# Patient Record
Sex: Male | Born: 1975 | Race: Black or African American
Health system: Western US, Academic
[De-identification: ages and names within clinical notes are randomized; demographics above are authoritative.]

## PROBLEM LIST (undated history)

## (undated) DIAGNOSIS — I10 Essential (primary) hypertension: Secondary | ICD-10-CM

## (undated) DIAGNOSIS — J45909 Unspecified asthma, uncomplicated: Secondary | ICD-10-CM

## (undated) DIAGNOSIS — Z9889 Other specified postprocedural states: Secondary | ICD-10-CM

## (undated) HISTORY — DX: Unspecified asthma, uncomplicated: J45.909

## (undated) HISTORY — PX: PR BX/EXC LYMPH NODE OPEN DEEP CERVICAL NODE: 38510

## (undated) HISTORY — DX: Other specified postprocedural states: Z98.890

## (undated) HISTORY — PX: OTHER SURGICAL HISTORY: SHX169

## (undated) DEATH — deceased

---

## 2010-05-23 ENCOUNTER — Other Ambulatory Visit (HOSPITAL_BASED_OUTPATIENT_CLINIC_OR_DEPARTMENT_OTHER): Payer: Self-pay | Admitting: Anatomic Pathology & Clinical Pathology

## 2010-05-29 LAB — BONE MARROW

## 2011-05-31 ENCOUNTER — Encounter (INDEPENDENT_AMBULATORY_CARE_PROVIDER_SITE_OTHER): Payer: Self-pay | Admitting: Family Medicine

## 2011-05-31 ENCOUNTER — Ambulatory Visit (INDEPENDENT_AMBULATORY_CARE_PROVIDER_SITE_OTHER): Payer: BLUE CROSS/BLUE SHIELD | Admitting: Family Medicine

## 2011-05-31 VITALS — BP 118/81 | HR 68 | Temp 98.2°F | Resp 22 | Wt 152.0 lb

## 2011-05-31 MED ORDER — ALBUTEROL SULFATE HFA 108 (90 BASE) MCG/ACT IN AERS
INHALATION_SPRAY | RESPIRATORY_TRACT | Status: DC
Start: 2011-05-31 — End: 2013-06-01

## 2011-05-31 NOTE — Progress Notes (Signed)
Garrett Ward is a 35 year old male here for headache  Duration: several months, last about 2 hours,   Location: front of the head  Quality: pressure on the head  Timing: off and on time  Severity: mild   Context: no at any time of the day  Modifying factors: takes aleve and that helps  Associated Signs and Symptoms: Cough      Cough  Duration It has been going on for: 4-5 days  Location It is at the: chest tightness and loss of breath  Quality It feels like: hard for him to breath  Timing: some of the time  Severity: mild  Context: shortness of breath at times after smoking  Associated Signs and Symptoms: no fevers or chills.     He has had some weight loss      No current outpatient prescriptions on file.    History     Social History   . Marital Status: N/A     Spouse Name: N/A     Number of Children: N/A   . Years of Education: N/A     Occupational History   . Not on file.     Social History Main Topics   . Smoking status: Current Everyday Smoker   . Smokeless tobacco: Not on file   . Alcohol Use:    . Drug Use:    . Sexually Active: Not on file     Other Topics Concern   . Not on file     Social History Narrative   . No narrative on file       Filed Vitals:    05/31/11 1255   BP: 118/81   Pulse: 68   Temp: 98.2 F (36.8 C)   TempSrc: Oral   Resp: 22   Weight: 152 lb (68.947 kg)   SpO2: 100%                   Review of Systems   Constitutional: Negative.    Eyes: Negative.    Gastrointestinal: Negative.    Genitourinary: Negative.    Musculoskeletal: Negative.    Neurological: Negative.        Physical Exam   Constitutional: He is oriented to person, place, and time. He appears well-developed and well-nourished.   HENT:   Head: Normocephalic and atraumatic.   Right Ear: External ear normal.   Left Ear: External ear normal.   Eyes: Conjunctivae and EOM are normal. Pupils are equal, round, and reactive to light. Right eye exhibits no discharge. Left eye exhibits no discharge. No scleral icterus.   Neck:  Normal range of motion. Neck supple. No JVD present. No tracheal deviation present. No thyromegaly present.   Cardiovascular: Normal rate, regular rhythm, normal heart sounds and intact distal pulses.  Exam reveals no gallop and no friction rub.    No murmur heard.  Pulmonary/Chest: Effort normal and breath sounds normal. No stridor. No respiratory distress. He has no wheezes. He has no rales. He exhibits no tenderness.   Abdominal: Soft. Bowel sounds are normal. He exhibits no distension and no mass. There is no tenderness. There is no rebound.   Musculoskeletal: Normal range of motion. He exhibits no edema and no tenderness.   Lymphadenopathy:     He has no cervical adenopathy.   Neurological: He is alert and oriented to person, place, and time. He has normal reflexes. He displays normal reflexes. No cranial nerve deficit. He exhibits normal muscle tone.  Coordination normal.   Psychiatric: He has a normal mood and affect. His behavior is normal. Judgment and thought content normal.     Assessment and Plan    786.2 Cough  Comment:   Plan: SPIROMETRY, ONSITE, X-RAY CHEST 2 VW, ALBUTEROL        SULFATE (PROAIR HFA) 108 (90 BASE) MCG/ACT         Inhalation Aero Soln, AIRWAY INHALATION         TREATMENT, ALBUTEROL NON-COMP UNIT, SPIROMETRY,        ONSITE  At this time the results of the spiro are a little abnormal.  It looks like he has an obstructive lung disease.  The patients xray was reviewed by myself and looks normal.  The patient has a history of asthma and this being the case, It think a course of albuterol is appropriate even though this is unlikely secondary to no change in pre/post spriro.    This may be secondary to a URI.  I will see him back an 7-14 day.  If he is not better then I will get another spiro and may need to send him to a pulmonologist or get a CT scan.    Small concern for some inflammatory process in the lungs or some other growth.  Will wait and see.             465.9 URI, acute  headache  Comment:  Plan: SPIROMETRY, ONSITE, X-RAY CHEST 2 VW, ALBUTEROL        SULFATE (PROAIR HFA) 108 (90 BASE) MCG/ACT         Inhalation Aero Soln, AIRWAY INHALATION         TREATMENT, ALBUTEROL NON-COMP UNIT, SPIROMETRY,        ONSITE          Ibuprofen for the headache

## 2011-05-31 NOTE — Patient Instructions (Signed)
Index Illustration Illustration Related topics         Asthma   What is asthma?   Asthma is a lung condition that causes wheezing, coughing, and shortness of breath. It is caused by inflammation (swelling) of the lining of the airways in your lungs. Asthma is a chronic condition, which means you may have it the rest of your life.   You may start coughing or wheezing when you breathe in irritants or something you are allergic to. Cold air, chemicals, perfume, and smoke are examples of irritants. Examples of things you might be allergic to, called allergens, are dust, pollen, molds, and animal dander. A cold or the flu might also bring on an asthma attack.   Some people have coughing or wheezing only during or after physical activity. This is called exercise-induced asthma.   Asthma may be mild, moderate, or severe. An asthma attack may last a few minutes or for days. Attacks can happen anywhere and at any time. Severe asthma attacks can be fatal. It is very important to get prompt treatment for asthma attacks and to learn to manage your asthma so you can live a healthy, active life.   About 20 million Americans have asthma, and the number of people who have asthma is increasing worldwide.   How does it occur?   If you have asthma, the airways in your lungs are always somewhat inflamed, even when you do not have any symptoms. When your airways are exposed to irritants or allergens, the airways become more swollen and make more mucus. The tiny muscles in the walls of the airways contract. These reactions cause the airway openings to become smaller, making it harder for air to move in and out. Wheezing is the sound of air moving through the narrowed air passages. The extra mucus in the airways causes coughing.   Some of the factors that may increase the risk of developing asthma are:    low birth weight   having one or more close family members who have asthma   exposure to secondhand smoke or a lot of  environmental pollutants (for example, in a large city)   on-the-job exposure to chemicals, such as the chemicals used in the manufacturing industry, farming, and hairdressing   obesity.  What are the symptoms?   Symptoms are:    wheezing (a high-pitched whistling sound when you breathe in or out)   coughing   shortness of breath, or feeling like you cannot get enough air   chest tightness  You may find that there are things you used to do that you can no longer do because of your trouble with breathing.   How is it diagnosed?   A single attack of wheezing does not mean you have asthma. Some infections and chemicals can cause wheezing that lasts for a short time and then does not happen again. Your healthcare provider will ask about your history of breathing problems. You will have a physical exam. You may have one or more breathing tests. You may be tested before and after taking medicine to see how your symptoms respond to medicine.   How is it treated?   The goal of treatment is to allow you to live a normal, active life. Proper treatment can reduce your day-to-day asthma problems as well as the chance that you will have a bad asthma attack or more problems in the future. Treatment will probably include prescribed medicines and the removal of obvious allergy-causing substances or   irritants from your home.   Two types of medicines are used to control asthma:    quick-relief medicines   long-term-control medicines.  Quick-relief medicines (also called reliever, rescue, or quick-acting medicines)   Albuterol is the generic name of the most widely used quick-relief medicine. It is a type of medicine called a bronchodilator. Bronchodilators relax the muscles in the airways. When the muscles are relaxed, the airways become larger, so there is more space for air to move in and out. You inhale the medicine by breathing it into your lungs as you spray it into your mouth. You use this medicine when you start to  have an asthma attack. In some cases your provider may recommend that you use it on a regular schedule.   You should always carry a bronchodilator with you to use when you begin to wheeze. If you have exercise-induced asthma, you should use the medicine before exercise to prevent wheezing.   Steroid medicines for prolonged, severe attacks   Steroids are another type of medicine that may be used to control asthma symptoms. They are a type of anti-inflammatory medicine that is often used for treatment of a bad attack that has not responded to other treatment. The medicine is usually prednisone and you are usually given between 4 and 10 days of the steroid tablets to take with your other medicines to get your asthma back under control.   Long-term control-medicines (also called controller medicines)   In addition to using a quick-relief bronchodilator when you have asthma attacks, you may need to combine different types of long-term control medicines for the best control of your wheezing. Several types of medicines help prevent asthma attacks. These medicines are now considered the best and safest way to have long-term control of asthma. They help reduce the inflammation in your airways. They are taken on a regular schedule whether or not you are having symptoms.   Long-term-control medicine should be used all the time for year-round asthma. For seasonal asthma, your healthcare provider may instruct you to take a long-term-control medicine just during the months when you usually have asthma symptoms. The medicine does not work well if you start and stop it frequently, for example, every week or two.   Long-term-control medicines cannot stop attacks of wheezing after you have started wheezing. You must use a quick-relief medicine, such as albuterol, when you are wheezing.   The goals of controller medicines are to:    prevent asthma attacks   prevent chronic asthma symptoms, such as shortness of breath   let you have  a fully active life, including participation in sports and other physical activities.  The medicines used most often for long-term control of asthma are:    a long-acting, inhaled bronchodilator called salmeterol (Serevent) used 2 times a day   inhaled steroids, such as Azmacort and Flovent, used 1 to 4 times a day   a type of medicine called leukotriene modifiers, including zafirlukast (Accolate), zileuton (Zyflo), or montelukast (Singulair) pills taken daily.  Some of these medicines are available as combinations.   Other controller medicines include:    theophylline, a pill often taken at bedtime to prevent nighttime wheezing   cromolyn or nedocromil, inhaled 3 to 4 times a day.  If you have severe persistent asthma, your healthcare provider may prescribe low-dose oral steroids for long-term control of the asthma. This medicine may be taken as tablets or a syrup.   You need to work closely with your healthcare   provider to find the treatment right for you. Make sure you understand how to use each of your medicines. Some are quick-acting and meant to be used when you have an asthma attack. Others are slow acting and help prevent attacks but they do not help when you are having an attack.   Inhalers   Make sure you know how to use your inhaler correctly. Some inhalers work best if you hold them 2 inches in front of your mouth when you spray. This helps the medicine get to your lungs rather than getting stuck in your mouth. However, for some inhalers you do need to put your mouth on the inhaler.   If you have an inhaler that should be used a couple of inches in front of your mouth and it is hard for you to hold the inhaler in the right position, ask your healthcare provider for a spacer tube. You can put one end of the spacer in your mouth and attach the inhaler to the other end. This allows you to breathe in slowly and fully and to inhale more of the asthma medicine.   Be sure you ask your healthcare provider  or pharmacist how your inhalers are supposed to be used. Read the directions that come with the inhalers. Also ask your pharmacist how you can know when your inhaler is empty so you can avoid getting caught without medicine.   Peak flow meter   Your breathing ability can change from day to day. For example, illness or seasonal allergies may make your airways more inflamed than usual. Your healthcare provider may prescribe a peak flow meter. You can use the peak flow meter to measure how well you are breathing. It can help you and your healthcare provider know when you might need to increase your dosage of medicine to prevent severe attacks of wheezing.   How long will the effects last?   Asthma is a chronic condition, even though you might not have any symptoms for decades. Asthma is more common in children than adults. People who had asthma as children often have no symptoms once they become adults, but the symptoms may come back later in life. Asthma that develops for the first time in mid- or late life usually continues to be a problem for the rest of your life.   How can I take care of myself?   Learn about asthma and its treatment.    Learn to recognize signs and symptoms of an asthma attack. Many people with asthma either don't recognize their asthma symptoms or underestimate the severity of their symptoms. Pay attention to your symptoms and, if your healthcare provider recommends it, check your breathing with a peak flow meter.   Work with your healthcare provider to develop a written asthma action plan. Following the plan will help you manage your asthma every day. It will help you recognize and handle asthma problems.   Take your medicines exactly as prescribed. Always have your rescue medicine on hand and available. If you are taking a long-term-controller medicine, be sure to take it every day, just as your provider tells you. This prevents asthma attacks. If you are having wheezing even though you are  using your controller medicines, let your healthcare provider know. Don't just stop them.   Learn what can trigger your symptoms and how to stay away from them. For example, you may need to cover your mattress, box springs, and pillows with zippered plastic covers. It may help to   stay indoors when the humidity or pollen count is high. Avoid cigarette smoke.  You should also:    See your healthcare provider for regular checkups as often as recommended.   Ask your provider if it is OK for you to take aspirin. Some people with asthma are allergic to aspirin and it causes them to wheeze. Aspirin is more likely to cause problems than other anti-inflammatory medicines, such as ibuprofen or naproxen, but sometimes they can cause wheezing, too. Acetaminophen (Tylenol) does not cause wheezing.   Get a flu vaccine every October.  If you often have problems with acid indigestion--a condition called gastroesophageal reflux disease, or GERD--your asthma symptoms may increase, especially at night. When you have GERD, stomach acid flows backward into the throat, irritating the upper airway and causing heartburn. If you have heartburn often, talk to your healthcare provider about it. Your provider may prescribe a medicine that reduces the acid in your stomach to help relieve GERD and asthma symptoms. You can also prevent or relieve symptoms of GERD by:    losing weight if you are overweight.   sleeping with your head elevated at least 4 inches.  Asthma can be a life-threatening condition. If your medicines do not seem to be working to keep you breathing comfortably, contact your healthcare provider. If you are having an asthma attack and using your albuterol inhaler has not relieved your symptoms, you must get medical care right away. This may mean going to the emergency room or calling 911.   You can get more information from:   American College of Allergy, Asthma and Immunology   Phone: 1-847-427-1200   Web site:  http://www.acaai.org   American Lung Association   Phone: 1-800-Lung-USA (586-4872)   Web site: http://www.lungusa.org   Asthma and Allergy Foundation of America (AAFA)   Phone: 1-800-7ASTHMA (727-8462)   Web site: http://www.aafa.org   Developed by [employer].   Published by [employer].   Last modified: 2007-09-04   Last reviewed: 2007-02-19   This content is reviewed periodically and is subject to change as new health information becomes available. The information is intended to inform and educate and is not a replacement for medical evaluation, advice, diagnosis or treatment by a healthcare professional.   Adult Health Advisor 2009.4 Index   2009 [employer] and/or its affiliates. All Rights Reserved.

## 2011-06-01 ENCOUNTER — Encounter (INDEPENDENT_AMBULATORY_CARE_PROVIDER_SITE_OTHER): Payer: Self-pay | Admitting: Family Medicine

## 2011-06-02 LAB — X-RAY CHEST 2 VW

## 2011-06-07 ENCOUNTER — Ambulatory Visit (INDEPENDENT_AMBULATORY_CARE_PROVIDER_SITE_OTHER): Payer: BLUE CROSS/BLUE SHIELD | Admitting: Family Medicine

## 2011-06-07 ENCOUNTER — Encounter (INDEPENDENT_AMBULATORY_CARE_PROVIDER_SITE_OTHER): Payer: Self-pay | Admitting: Family Medicine

## 2011-06-07 VITALS — BP 117/80 | HR 63 | Temp 98.6°F | Resp 20 | Wt 152.0 lb

## 2011-06-07 NOTE — Patient Instructions (Addendum)
Index Spanish version        Follow up with PFT, after you get them then  I will call and talk with you about he next step.  As we have talked about this, I will call you back.   You may need to see a specialist or go get an additional scan.

## 2011-06-07 NOTE — Progress Notes (Signed)
Garrett Ward is a 35 year old male here for follow up on shortness of breath.He is feeling better since last time that I saw him.  Duration: 2 weeks  Quality: sounds wet, non productive  Timing: dry cough, some productive cough  Severity: moderate  Context: after a URI  Modifying factors: was given an albuterol has not used it much  Associated Signs and Symptoms: no weight loss, no normal shortness of breath, no history of asthma    Current outpatient prescriptions:ALBUTEROL SULFATE (PROAIR HFA) 108 (90 BASE) MCG/ACT Inhalation Aero Soln, 2 puffs inhaled every 4 hours as needed for asthma; may use every 2 hours for attacks, or preventively before exercise, Disp: 1 Inhaler, Rfl: 2    History     Social History   . Marital Status: N/A     Spouse Name: N/A     Number of Children: N/A   . Years of Education: N/A     Occupational History   . Curator      Social History Main Topics   . Smoking status: Current Everyday Smoker   . Smokeless tobacco: Not on file   . Alcohol Use: Not on file   . Drug Use: Yes   . Sexually Active: Yes -- Male partner(s)     Other Topics Concern   . Not on file     Social History Narrative   . No narrative on file       Filed Vitals:    06/07/11 0759   BP: 117/80   Pulse: 63   Temp: 98.6 F (37 C)   TempSrc: Oral   Resp: 20   Weight: 152 lb (68.947 kg)   SpO2: 100%       No results found for this or any previous visit.          Review of Systems   Constitutional: Negative.    HENT: Negative.    Eyes: Negative.    Respiratory: Positive for cough, chest tightness and shortness of breath.    Cardiovascular: Negative.    Gastrointestinal: Negative.    Musculoskeletal: Negative.    Neurological: Negative.        Physical Exam   Constitutional: He appears well-developed and well-nourished. No distress.   Cardiovascular: Normal rate, regular rhythm, normal heart sounds and intact distal pulses.  Exam reveals no gallop and no friction rub.    No murmur heard.  Pulmonary/Chest: Effort normal  and breath sounds normal. Not tachypneic. No respiratory distress. He has no decreased breath sounds. He has no wheezes. He has no rhonchi. He has no rales. He exhibits no tenderness.   Abdominal: Soft. Bowel sounds are normal.   Skin: He is not diaphoretic.       Assessment and Plan    786.05 Shortness of breath  (primary encounter diagnosis)  Comment:    Plan: SPIROMETRY, ONSITE, REFERRAL TO [facility]         LAB           At this time I have repeated the spirometry.  It continues to be abnormal.  It says his lung age is 23.  I'm somewhat concerned if about his current lung function.  Although this could be secondary to the transient his upper respiratory tract infection I think this is more likely something else.  I've sent him for formal pulmonary function tests.  I've also given him a referral to  a pulmonologist. This may be some sort of autoimmune disease,I feel  that cancer is unlikely considering his chest xray was normal.

## 2011-06-08 ENCOUNTER — Encounter (INDEPENDENT_AMBULATORY_CARE_PROVIDER_SITE_OTHER): Payer: Self-pay | Admitting: Family Medicine

## 2011-06-12 ENCOUNTER — Ambulatory Visit (HOSPITAL_BASED_OUTPATIENT_CLINIC_OR_DEPARTMENT_OTHER): Payer: BLUE CROSS/BLUE SHIELD | Attending: Family Medicine

## 2011-06-12 ENCOUNTER — Ambulatory Visit (HOSPITAL_BASED_OUTPATIENT_CLINIC_OR_DEPARTMENT_OTHER): Payer: Self-pay

## 2011-06-12 DIAGNOSIS — R0602 Shortness of breath: Secondary | ICD-10-CM | POA: Insufficient documentation

## 2011-06-29 ENCOUNTER — Telehealth (INDEPENDENT_AMBULATORY_CARE_PROVIDER_SITE_OTHER): Payer: Self-pay | Admitting: Family Medicine

## 2011-06-29 NOTE — Telephone Encounter (Signed)
Results available in [facility].  Forwarding to provider to advise on request.

## 2011-06-29 NOTE — Telephone Encounter (Deleted)
Called, results are normal.  No need to follow up at this time.

## 2011-06-29 NOTE — Telephone Encounter (Signed)
CONFIRMED PHONE NUMBER: [phone]  CALLERS FIRST AND LAST NAME: Dicie Beam  FACILITY NAME: na TITLE: na  CALLERS RELATIONSHIP:Self  RETURN CALL: General message OK    SUBJECT: Results Request   REASON FOR REQUEST: Had Pulmonary Diagnostics at [facility] lab on 06/12/11. Want the dr to call him and discuss the results    WHO ORDERED THE TEST: Dr.Carey  TYPE OF TEST? Other Pulmonary Testing  WHERE WAS THE TEST DONE: [facility]  WHEN WAS THE TEST DONE: 06/12/11

## 2011-07-03 ENCOUNTER — Telehealth (INDEPENDENT_AMBULATORY_CARE_PROVIDER_SITE_OTHER): Payer: Self-pay | Admitting: Family Medicine

## 2011-07-03 MED ORDER — FLUTICASONE PROPIONATE HFA 110 MCG/ACT IN AERO
INHALATION_SPRAY | RESPIRATORY_TRACT | Status: DC
Start: 2011-07-03 — End: 2012-05-21

## 2011-07-03 NOTE — Telephone Encounter (Signed)
Called and talked with patient, he is still short of breath, will prescribed medications, if it is not better in 2 weeks then he is to return.

## 2011-07-03 NOTE — Telephone Encounter (Signed)
Duplicate request. See 12/7 TE.

## 2011-07-03 NOTE — Telephone Encounter (Signed)
CALLERS FIRST AND LAST NAME: Tracen Makieth Scullin   FACILITY NAME: n/a TITLE: n/a   CALLERS RELATIONSHIP:Self   RETURN CALL: General message OK   SUBJECT: Results Request   REASON FOR REQUEST: Results of breathing tests done 06/12/11   WHO ORDERED THE TEST:Carey, Guadelupe Sabin, MD   TYPE OF TEST? Other Breathing tests   WHERE WAS THE TEST DONE: [facility]   WHEN WAS THE TEST DONE: 06/12/11

## 2011-07-03 NOTE — Telephone Encounter (Signed)
CONFIRMED PHONE NUMBER:   Telephone Information:   Home Phone [phone]   Work Phone [phone]   Mobile [phone]       CALLERS FIRST AND LAST NAME: Michaell Cowing      FACILITY NAME: n/a TITLE: n/a  CALLERS RELATIONSHIP:Self  RETURN CALL: General message OK    SUBJECT: Results Request   REASON FOR REQUEST: Results of breathing tests done 06/12/11    WHO ORDERED THE TEST:Carey, Guadelupe Sabin, MD    TYPE OF TEST? Other Breathing tests  WHERE WAS THE TEST DONE: [facility]  WHEN WAS THE TEST DONE: 06/12/11

## 2012-05-02 ENCOUNTER — Telehealth (INDEPENDENT_AMBULATORY_CARE_PROVIDER_SITE_OTHER): Payer: Self-pay | Admitting: Family Medicine

## 2012-05-02 ENCOUNTER — Encounter (INDEPENDENT_AMBULATORY_CARE_PROVIDER_SITE_OTHER): Payer: Self-pay | Admitting: Family Medicine

## 2012-05-02 ENCOUNTER — Ambulatory Visit (INDEPENDENT_AMBULATORY_CARE_PROVIDER_SITE_OTHER): Payer: BLUE CROSS/BLUE SHIELD | Admitting: Family Medicine

## 2012-05-02 VITALS — BP 123/82 | HR 67 | Temp 98.4°F | Wt 160.0 lb

## 2012-05-02 MED ORDER — FLUOXETINE HCL 10 MG OR TABS
ORAL_TABLET | ORAL | Status: DC
Start: 2012-05-02 — End: 2012-05-20

## 2012-05-02 MED ORDER — SUMATRIPTAN SUCCINATE 25 MG OR TABS
ORAL_TABLET | ORAL | Status: DC
Start: 2012-05-02 — End: 2012-09-12

## 2012-05-02 MED ORDER — PROCHLORPERAZINE MALEATE 10 MG OR TABS
ORAL_TABLET | ORAL | Status: DC
Start: 2012-05-02 — End: 2012-09-12

## 2012-05-02 NOTE — Telephone Encounter (Signed)
CONFIRMED PHONE NUMBER: [phone]  CALLERS FIRST AND LAST NAME: Michaell Cowing  FACILITY NAME: N/A TITLE: N/A  CALLERS RELATIONSHIP:Self  RETURN CALL: OK to leave detailed message with anyone that answers    SUBJECT: Appointment Request   REASON FOR REQUEST: Social Work    REQUEST APPOINTMENT WITH: Nida Boatman or Cheree Ditto  REFERRING PROVIDER: N/A  REQUESTED DATE: N/A  REQUESTED TIME: N/A  UNABLE TO APPOINT: Schedule unavailable

## 2012-05-02 NOTE — Progress Notes (Signed)
Pt here for constant headache x couple weeks, intensifies sometimes, other times its dull ache, but continuously there.  Head pain located front L side of head.   Also c/o sleep problem for couple months, wakes up every hour, or the most can get maybe 4 hours of sleep.      Per orders of Dr. Gar Gibbon, injection of ketorolac 60mg  given by Candie Mile. Patient instructed to remain in clinic for 20 minutes afterwards, and to report any adverse reaction to me immediately.      Vaccine given today without initial adverse effect. YES    .Candie Mile, MA

## 2012-05-02 NOTE — Telephone Encounter (Signed)
Called patient to offer an appointment.   Patient scheduled to see Verlon Au on 10/18 @ 2:30pm.  Thanks and routing to SW.

## 2012-05-02 NOTE — Progress Notes (Signed)
SUBJECTIVE:  CC: headaches  Location: left frontal  Onset: gradual onset  Quality: throbbing sometimes  Duration: lasts days   Timing: wakes with it, up all night with it.  Severity: 7-8/10  Alleviating/Exacerbating: sometimes sleep helps  Associated Sx: nausea, photo and phonophobia, hard time sleeping, a lot of stress, loss of appetite, poor sleep    PATIENT HEALTH QUESTIONNAIRE (PHQ-9)  Over the last 2 weeks, how often have you been bothered by any of the following problems?    1. Little interest or pleasure in doing things: more than half the days (2)  2. Feeling down, depressed, or hopeless: more than half the days (2)  3. Trouble falling or staying asleep,or sleeping too much: nearly every day  (3)  4. Feeling tired or having little energy: several days  (1)  5. Poor appetite or overeating: nearly every day  (3)  6. Feeling bad about yourself-or that you are a failure or have let yourself or your family down: nearly every day  (3)  7. Trouble concentrating on things, such as reading the newspaper or watching television: nearly every day  (3)  8. Moving or speaking so slowly that other people could have noticed. Or the opposite-being so fidgety or restless that you have been moving around a lot  more than usual: several days  (1)  9. Thoughts that you would be better off dead, or of hurting yourself in some way: several days  (1) (passive)    Total Score Depression Severity  1-4 Minimal depression  5-9 Mild depression  10-14 Moderate depression  15-19 Moderately severe depression  20-27 Severe depression    10. If you checked off any problems, how difficult have these problems made it for  you to do your work, take care of things at home, or get along with other people? Somewhat difficult  ---------------------------------------------------------------------------------------------------------  PHQ-9 is adapted from [employer], developed by Drs Molly Maduro L. Spitzer, Nance Pear, Tora Perches, and  colleagues, with an educational grant from [employer]. For research information, contact Dr Everlene Other at [email or web address]. Use of the PHQ-9 may only be made in accordance with the Terms of Use available at This[employer]com.ee. Copyright [employer]. All rights reserved. 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    Reports he does better when he's with other people and gets worse when he's by himself. Gets caught up in his thoughts.    ROS: no fevers, depressed mood, +HA, +insomnia, +GI as above    Reviewed PMH/Problem List  Patient Active Problem List   Diagnosis   . Asthma       Reviewed Medications:   Outpatient Prescriptions Prior to Visit   Medication Sig Dispense Refill   . ALBUTEROL SULFATE (PROAIR HFA) 108 (90 BASE) MCG/ACT Inhalation Aero Soln 2 puffs inhaled every 4 hours as needed for asthma; may use every 2 hours for attacks, or preventively before exercise  1 Inhaler  2   . Fluticasone Propionate  HFA (FLOVENT HFA) 110 MCG/ACT Inhalation Aerosol use 1 puff 2 times per day  1 Inhaler  0       OBJECTIVE:  BP 123/82  Pulse 67  Temp 98.4 F (36.9 C) (Temporal)  Wt 160 lb (72.576 kg)  SpO2 100%  Gen: well appearing, no distress  Eyes: Clear, EOMI, perrl  Throat: - erythema, - exudate, symmetrical palate  Neck: full ROM  Ears: clear B/L TM's  CN 2-12 grossly intact, AxO  Gait: WNL  Psych: depressed mood and affect, slowed speech    ASSESSMENT/PLAN:  346.90 Migraine  (primary encounter diagnosis)  Comment: likely exacerbated by depression and insomnia, perhaps also ETOH  Plan: SUMAtriptan Succinate 25 MG Oral Tab,         Prochlorperazine Maleate 10 MG Oral Tab,         KETOROLAC TROMETHAMINE INJ        Injection today, precautions provided, education on medications prescribed, advised to cut down ETOH use, will f/u with SW-er as scheduled    311 Depression  Comment: dx today, denies dx in hx  Plan: FLUoxetine HCl 10 MG Oral Tab        Clearly major depression. PHQ9 = 19. F/u with SW-er as above.  May be good fit for BHIP. Will place referral and can ascertain if he is comfortable seeing a different SW-er than planned but also may qualify for outside psychology/social work? Would appreciate deeper investigation into ETOH use and dependence as there was time to address today. SI is passive without plan. CC RN to call to check on him in early next week.

## 2012-05-05 ENCOUNTER — Telehealth (INDEPENDENT_AMBULATORY_CARE_PROVIDER_SITE_OTHER): Payer: Self-pay

## 2012-05-05 NOTE — Telephone Encounter (Signed)
Pt has started Fluoxetine.  No side effects.  No SI.  Gave # to Suicide Hotline. He is aware of appt with LCheree Ditto SW on 05/09/12 at 2:30 pm.  Advised pt call with further questions or concerns.

## 2012-05-05 NOTE — Telephone Encounter (Signed)
/  u requested for depression -- did he get his meds? Any side effects? Reiterate warning about calling or provide crisis # for any worsening SI. THanks!            Provider requesting RN touch base with patient Mon/Tues to discuss above.  Patient seen in clinic on 10/11 and put on fluoxetine.

## 2012-05-09 ENCOUNTER — Ambulatory Visit (INDEPENDENT_AMBULATORY_CARE_PROVIDER_SITE_OTHER): Payer: BLUE CROSS/BLUE SHIELD | Admitting: Counselor

## 2012-05-09 ENCOUNTER — Ambulatory Visit (INDEPENDENT_AMBULATORY_CARE_PROVIDER_SITE_OTHER): Payer: Self-pay | Admitting: Counselor

## 2012-05-09 NOTE — Progress Notes (Signed)
Garrett Ward is a 36 year old male who is a patient of Dr. Iona Hansen, Guadelupe Sabin    BHIP INITIAL CLINICAL ASSESSMENT      CURRENT MENTAL HEALTH CONCERNS/REASON(S) FOR VISIT:   On 05/09/12  the patient a 36 year old, male was referred by Vivia Budge to the [facility] Integration Program [facility]) for Depression.    PATIENT'S GOAL(S) FOR TREATMENT:    PSYCHIATRIC HX:   HOSPITALIZATION(S): none    OUTPATIENT TREATMENT: none  PAST DIAGNOSES: none  SUICIDE ATTEMPTS: none  SAFETY CONCERNS none   FIREARMS:unknown   CURRENT/PAST MENTAL HEALTH MEDICATIONS:None    MEDICAL CONCERNS: None      SUBSTANCE USE/ABUSE:  (pull in from previous PCP/MA note and update)   Alcohol: Patient reports alcohol use since age 8 socially.  He has been drinking more over the past two years after he divorced his wife.  He believes his alcohol use is part of the problem that ended his most recent relationship.  Marijuana:none  Other: none   Tobacco:unknown   Caffeine: unknown  Treatment History:  none    ADLS:  Eating Habits: Decline in eating.  "I don't always feel like it"  Sleeping Habits: Patient sleeping only about five hours.  Feels like his mind is "racing" when he tries to sleep.  He has a new work schedule where he starts work at [employer].  Concentration: Patient believes his concentration has diminished some although he is conscientious about this especially when at work.  Self-Care: No concerns re: hygiene    Exercise Habits:: None regularly.  Plays flag football and basketball    MENTAL STATUS:.    ORIENTATION: alert and oriented   ATTITUDE: Cooperative  EPS/TD BHIP: None  APPEARANCE: Appropriate hygiene and grooming;  PSYCHOMOTOR: None  AFFECT: Flat   MOOD: Depressed  SUICIDALITY: Denies and Passive  HOMICIDALITY: Denies   HARM LETHALITY: Low  DEGREE OF ANIMOSITY: none    IMPULSE CONTROL: Controlled  A/V HALLUCINATIONS: unknown  TREATMENT COMPLIANCE: compliant  COUNSELING INTERVENTIONS: Support today and explained BHIP  program.  Asked patient to consider out patient substance abuse.  He is not interested in AA groups.  Speech: Appropriate rate and rhythm  THOUGHT PROCESS:   THOUGHT CONTENT: appropriate  COGNITION:within normal limits  JUDGEMENT: good  INSIGHT: fair  BHIP CARE COORDINATION: PCP  BHIP REFERRALS: social work  BHIP TREATMENT DURATION: 3 months  BHIP SERVICE TYPE: care coordination    SOCIAL/FAMILY HISTORY:   .  Pt. was born and raised in [place].  He has lived in [place] for the past 13 years.  Patient stated that he moved to "start over" and has a supportive male cousin that has assisted him.  He works full time as a Curator.  He has no children and has been married.  He was divorced about 3 years ago and at that time, noticed an increase in alcohol consumption.  He recently had his last relationship broken when his partner left him and he has had no contact with her.  He believes his drinking was a stressor for her.  He is distraught over this loss and has had trouble eating, sleeping, and functioning.  Most recently, he has had some headaches which have lessened the past two weeks.  He is starting to reengage socially with friends but does not feel happy.    MARITAL STATUS/RELATIONSHIPS: divorced  EDUCATION:  12th grade; no diploma   EMPLOYMENT HX: employed, occupation Curator   SIGNIFICANT EVENTS/TRAUMA HX:  Recent break up with partner of two years  LEGAL HX:  unknown  OTHER: none    STRESSORS, STRENGTHS, RESOURCES:  Stressors: marital problems; depression; alcohol abuse  Strengths: common sense; interested in help; open to feedback  Resources: friends and cousin  Interests/Hobbies:  Basketball/football/friends    BARRIERS TO TREATMENT: none    ASSESSMENT/SUMMARY:   Garrett Ward is a 36 year old male presenting with depression and alcohol abuse.  He was asked about his willingness to participate and/or explore outpatient alcohol abuse treatment.  He was agreeable to thinking about this and had never considered  it.  He is not interested in [employer] as he perceives himself as quite private and introverted.  He would like more information on outpatient resources.  Patient is feeling depressed, sad and having difficulty understanding what is going on with him.  He was validated for reaching out and is also trying to get involved with friends again.  He works 40 plus hours per week and is interested in participating in counseling.  He makes good eye contact and is thoughtful with his responses.  Although he presents as somewhat flat, he appears motivated to make change and seek help.     Care Coordinator discussed Behavioral Health Integration Program [facility]) and treatment alternatives. Patient desires to participate in BHIP and agrees to be safe.      Intervention/Plan:  Stabilization of the depression with psychiatric medication and / or brief counseling interventions: Behavioral Activation, Cognitive Behavioral Therapy, Problem-Solving Therapy, Dialectical Behavioral Therapy, Distress Tolerance, Motivational Interviewing, Stress Management, Relapse Prevention, Pain-Management, skill building.  Substance use: Patient has agreed to consider outpatient resources and/or reduce use while participating in Bhip.  Patient will follow-up with Care Coordinator in 90 days.  Patient will follow-up with Kearney Hard, MSW on 05/28/12.

## 2012-05-20 ENCOUNTER — Ambulatory Visit (INDEPENDENT_AMBULATORY_CARE_PROVIDER_SITE_OTHER): Payer: BLUE CROSS/BLUE SHIELD | Admitting: Family Medicine

## 2012-05-20 ENCOUNTER — Encounter (INDEPENDENT_AMBULATORY_CARE_PROVIDER_SITE_OTHER): Payer: Self-pay | Admitting: Family Medicine

## 2012-05-20 VITALS — BP 138/82 | HR 74 | Temp 98.8°F | Wt 154.0 lb

## 2012-05-20 MED ORDER — FLUOXETINE HCL 20 MG OR TABS
ORAL_TABLET | ORAL | Status: DC
Start: 2012-05-20 — End: 2012-09-12

## 2012-05-20 NOTE — Progress Notes (Signed)
Pt here for depression f/u. No side effects to medication.    GAD-7 (Generalized Anxiety Disorder screening tool)  How often during the past 2 weeks have you felt bothered by:  0 = not at all  1 = several days  2 = more than half the days  3 = nearly everyday   1. Feeling nervous, anxious, or on edge  2   2. Not being able to stop or control worrying  3   3. Worrying too much about different things  2   4. Trouble relaxing  1   5. Being so restless that it is hard to sit still  1   6. Becoming easily annoyed or irritable  1   7. Feeling afraid as if something awful might happen  1                                          TOTAL:   11   If you checked off any problems, how difficult have these problems made it for you to do your work, take care of things at home, or get along with other people? somewhat difficult    Scoring: Add the results for question number one through seven to get a total score.  If you score 10 or above you might want to consider one or more of the following: discuss your symptoms with your doctor, contact a local mental health care provider or contact my office for further assessment and possible treatment. Although these questions serve as a useful guide, only an appropriate licensed health professional can make the diagnosis of Generalized Anxiety Disorder.

## 2012-05-20 NOTE — Progress Notes (Signed)
Vaccine Screening Questions      1.  Have you had a serious reaction or an allergic reaction to a vaccine?  NO    2.  Currently have a moderate or severe illness, including fever? (Don't Ask if vaccine   ordered by provider same day)  NO    3.  Ever had a seizure or a brain or other nervous system problem syndrome associated with a vaccine? (DTaP/TDaP/DTP pertinent) NO    4.  Is patient receiving  any live vaccinations today? (Varicella-Chickenpox, MMR-Measles/Mumps/Rubella, Zoster-Shingles)  NO    If YES to any of the questions above - Do NOT give vaccine.  Consult with RN or provider in clinic.  (#4 can be YES if all Live vaccine questions are answered NO)    If NO to all questions above - Patient may receive vaccine.    5.  Do you need to receive the Flu vaccine today? NO    Vaccine information sheet(s) discussed, patient/parent/guardian verbalized understanding? YES     VIS given 05/20/2012 by Ashley D Moseng MA      Vaccine given today without initial adverse effect. YES    Ashley Moseng, MA       .

## 2012-05-20 NOTE — Patient Instructions (Addendum)
Medication that may work for insomnia that isn't addictive is called melatonin. This is available over the counter without a prescription.      Insomnia   What is insomnia?   Having insomnia means you often have trouble falling or staying asleep or going back to sleep if you awaken. Insomnia can be either a short-term or a long-term problem.   Insomnia affects 1 in 3 adults every year in the [place].   How does it occur?   Causes of insomnia include:    stress such as a big deadline at work, a financial problem, or a sick family member    being overweight    depression, anxiety, or other mental health problems    medical problems such as sleep apnea or hyperthyroidism    restless leg syndrome (muscles in your lower legs twitch or tense up during sleep).    use of caffeine or other stimulants    use of alcohol, other depressants, or sedatives, which can relax you but lead to shallow sleep that starts and stops, especially if you use these drugs for a long time    medicines, such as those used to treat asthma    pain and other discomfort caused by an illness such as arthritis    shortness of breath caused by chronic obstructive pulmonary disease (COPD) or heart failure    poor sleep habits, including going to bed at different times or in a noisy environment, or eating or working in bed before sleeping    changes in sleep patterns because of different work hours or travel (jet lag)   Insomnia may be temporary (called situational insomnia) or ongoing (chronic insomnia).   Situational insomnia occurs with a stressful event. It is often caused by noise, pain, worry, or family, work, Surveyor, quantity, or school problems. It lasts 3 weeks or less. This kind of insomnia generally goes away when the stressful event is over or resolved.   Chronic insomnia can be caused by irregular sleep-wake patterns resulting from shift work, drug dependency (including long-term use of sleeping pills or alcohol), stress, illness,  or mental health problems such as anxiety or depression. It lasts longer than 3 weeks and requires treatment of the underlying problem.   What are the symptoms?   Symptoms include:    trouble falling asleep (taking longer than 45 minutes)    awakening often in the night    waking up early in the morning and being unable to go back to sleep    not feeling rested in the morning or feeling tired during the day    restlessness or anxiety as bedtime approaches   How is it diagnosed?   Your healthcare provider will ask you about:    your sleep patterns    use of caffeine, alcohol, medicine, and other drugs    eating and exercise habits    your mental and physical condition    your medical and mental health history, and your family's history    your job and travel patterns   Your healthcare provider may also ask your spouse, bed partner, or other family members about your sleep habits. After talking with you, your healthcare provider may give you a physical exam. A blood sample may be taken for lab tests.   Your healthcare provider may ask you to take notes each morning about:    how long you were in bed    how much time you think you actually slept  how many times and what times you woke up    what time you got up in the morning    your thoughts about the quality of your sleep    recent stresses   Your healthcare provider may suggest that you sleep overnight in a sleep center. At the sleep center you may have a continuous, all-night recording of your breathing, eye movements, muscle tone, blood oxygen levels, heart rate and rhythm, and brain waves.   How is it treated?   If a medical problem is causing your insomnia, your provider will treat you for it. If drug or alcohol abuse is the cause of your insomnia, you will need to stop using these substances. If you have chronic insomnia, it must be treated with management of the underlying problem.   In some cases of temporary insomnia, your healthcare  provider may prescribe medicine to help you sleep until the stressful event is over or resolved. Counseling may also help you deal with psychological problems or reduce stress that may cause or contribute to your insomnia.   Some sleeping medicine can be addictive. Your healthcare provider will work with you to choose the right medicine for short-term or long-term use.   Your healthcare provider may recommend relaxation techniques, changes in diet, cutting out caffeine, and a healthy lifestyle that includes exercise. Your provider also will probably discuss good sleep habits and a regular sleep routine.   How long will the effects last?   Often insomnia lasts for just a few nights. If you cannot sleep almost every night for 2 weeks, tell your healthcare provider. Insomnia that lasts this long usually continues until the cause is identified and treated.   How can I take care of myself?    Tell your healthcare provider if the treatment plan doesn't help.    Tell your provider if you have side effects from medicine prescribed for the insomnia.    Follow your provider's instructions for follow-up visits.   How can I help prevent insomnia?   Practice good sleep hygiene:    Establish a regular bedtime and wake-up time and stick to them even on weekends.    Avoid taking naps.    Exercise regularly during the day. Avoid exercising in the evening.    Keep light levels very low after sunset and keep the bedroom very dark.    Keep the bedroom at a cool temperature.    Use the bed only for sleep and sex, not for reading, using the computer, or watching television.    Go to bed when you are drowsy and get up when you are wide awake.    Avoid caffeine, other stimulants, cigarettes, and alcohol. Do not drink alcohol within 6 hours of bedtime. If you smoke, try to quit smoking entirely. Cutting back on smoking without quitting may lead to nicotine withdrawal in the middle of the night that awakens you.    Eat lightly at  the evening meal and avoid snacks after supper.    Lose weight if you are overweight.    Learn to use relaxation exercises.    Meditate for 20 minutes before you go to bed.    Read something light or entertaining just before you go to bed, to get your mind off the day's troubles.    Consider having white noise in the background, such as a fan blowing.    Try not to focus on falling asleep. For example, don't keep checking the clock and worry  about why you are not asleep yet. If you are awake for more than 30 minutes, leave the bed and do not go back to bed until you feel ready to sleep.    Try to reduce stress in your life by changing the things that cause stress.    Keep a "to do" journal. Before you go to bed, write down all the things you are worrying about. Then write down what you can do tomorrow. Loraine Leriche the other things as things to do later in the week. This will help clear your mind of worry.    Arrange your medicine schedule with your provider so that you take any drugs that might make you sleepy in the evening and drugs that may interfere with sleep during the day.    Avoid daily use of sleep medicines. You may become dependent on them or build up your tolerance to them so that they no longer work as well. Most sleeping pills should not be used for more than 2 weeks in a row.   Developed by Thana Ates Excell Seltzer, RN, [place], and [employer].   Published by [employer].   Last modified: 2007-05-28   Last reviewed: 2007-05-09   This content is reviewed periodically and is subject to change as new health information becomes available. The information is intended to inform and educate and is not a replacement for medical evaluation, advice, diagnosis or treatment by a healthcare professional.   Adult Health Advisor [phone] Index   2009 [employer] and/or its affiliates. All Rights Reserved.

## 2012-05-20 NOTE — Progress Notes (Signed)
CC: f/u depression/anxiety    HPI: Reports depression/anxiety symptoms are a bit better but biggest complaint now is sleeping. Having a hard time sleeping through the night. Has a TV in the bedroom and works an early shift (4AM - 3 PM) 4 days a week. Headaches are improving. More good days than he was having 2 weeks ago. Meeting with SW-er was beneficial. No side effects from fluoxetine. Taking 20 mg now in the morning (and for last 2 weeks or so). More active, handing out with friends.    GAD7 as per MA documentation  PHQ 9: 12    ROS: sleep disrupted as above, appetite still decreased, headaches improved    Past Medical History   Diagnosis Date   . H/O lymph node biopsy      neck   . Asthma      Outpatient Prescriptions Prior to Visit   Medication Sig Dispense Refill   . ALBUTEROL SULFATE (PROAIR HFA) 108 (90 BASE) MCG/ACT Inhalation Aero Soln 2 puffs inhaled every 4 hours as needed for asthma; may use every 2 hours for attacks, or preventively before exercise  1 Inhaler  2   . FLUoxetine HCl 10 MG Oral Tab 1 tab po qd for one week then increase to two tabs daily  60 Tab  0   . Fluticasone Propionate  HFA (FLOVENT HFA) 110 MCG/ACT Inhalation Aerosol use 1 puff 2 times per day  1 Inhaler  0   . Prochlorperazine Maleate 10 MG Oral Tab 1 tab for severe headache  6 Tab  0   . SUMAtriptan Succinate 25 MG Oral Tab 1 TABLET 1 TIME ONLY AT ONSET OF MIGRAINE,REPEAT EVERY 2 HR AS NEEDED up to 4 tabs daily  12 Tab  0     O: BP 138/82  Pulse 74  Temp 98.8 F (37.1 C) (Temporal)  Wt 154 lb (69.854 kg)  SpO2 100%  Gen: non toxic, NAD  Eyes: Clear, EOMI, perrl  Skin: no rashes  Psych: mood improved, occasional laugh and smile, normal thought process and content, congruent affect    A/P: 36 year old here with  296.20 Major depression, single episode  (primary encounter diagnosis)  Comment: anxiety is primary component. PHQ9: 19-->12  Plan: FLUoxetine HCl 20 MG Oral Tab        Continue at current dosing but switch to 20 mg  tablets. Follow up with counselor as scheduled. Consider dose increase if no further decrease in PHQ9. Counseled on staying active, going out with friends, sleep hygiene and insomnia.     ETOH use/abuse  ETOH is likely a large component of sleep issues as well, and counseled on reduction / cessation. He is agreeable to this plan.     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

## 2012-05-28 ENCOUNTER — Ambulatory Visit (INDEPENDENT_AMBULATORY_CARE_PROVIDER_SITE_OTHER): Payer: BLUE CROSS/BLUE SHIELD | Admitting: Clinical

## 2012-05-28 NOTE — Patient Instructions (Signed)
Please schedule next BHIP appointment for 30 minutes in 2 weeks.  You can schedule at front desk.  Thank you.

## 2012-05-28 NOTE — Progress Notes (Signed)
BHIP INTAKE PSYCH EVAL  (VERSION 04/2011)       CURRENT MENTAL HEALTH CONCERNS/REASON(S) FOR VISIT:  Pt had breakup two months ago, before that was not having any struggles, did note that this time last year did have some low mood - before that did not have any problems he could notice.  Pt has been drinking more since breakup with his girlfriend.  Work schedule also changed from 1p - 10p to 4a-3p (this happened Oct 1st).    PATIENT GOAL STATEMENT:  Look at changing drinking habits  Learn how to cope "with things,"   How to cope with changes, feelings related to changes.    VEGETATIVE:   Sleep:much improved in last week, last couple of days sleeping all night,   Appetite:  Concentration:  Energy:    PSYCHIATRIC HX:  See previous SWer assessment.  HOSPITALIZATION: no  OUTPATIENT TREATMENT: no  PAST DIAGNOSES: no  SUICIDE ATTEMPTS: no  SAFETY CONCERNS: no  PAST MEDICATIONS:  No hx, 20mg  prozac today.          SUBSTANCE ABUSE:  Alcohol: Was drinking every day, now trying to just drink on weekend "but I still drink about each night." A beer (MGD, Bud Light 22oz) or shot of tequila - will drink more heavily on weekend, especially when with friends.  Before (started middle of September) "when drinking every day," six shots per night or "until I fell asleep."  This was same for weekday or weekend.  Before breakup with SO was "not that much at all."  SO thought he drank a lot "but it was mostly on the weekend."        Marijuana: no    Other: no others    Tobacco: yes 3-4 cigarettes per day    Caffeine: none      LEGAL HISTORY:      FAMILY PSYCH AND SUBSTANCE USE HISTORY:  Maternal: grandfather: drank but stopped when "it was pretty bad."  No others  Paternal: does not really know that side of the family  Siblings: none      MENTAL STATUS:.    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  Family: mother/father alive, not married, 2 brothers on mom's side, brother/sister on father's side.  Childhood was good, lived mostly with grandparents, mother helped as well.  No hx of abuse/neglect as a child.       Marital Status/Relationship:    Education:  12th grade, did not graduate.  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  Pt appears to have no experience in counseling and struggles with coping skills for anxiety, distress and possible depression.  Pt would benefit from CBT for depression and/or anxiety, also distress tolerance skills and psychoeducation, brief interventions to address ETOH use.      Care Manager discussed Behavioral Health Integration Program [facility]) and tx alternatives. Patient desires to participate in BHIP and agrees to be safe.      Plan:   -PCP and Care Manager (CM) will monitor psych medications, side-effects, patient progress and coordinated care svcs.  -Psychiatrist and Care Manager will case review to evaluate psych meds and treatment plan.  Care Manager will collaborate with PCP  post psychiatrist case/CM review.  -Substance use: Patient has agreed to cut down on ETOH consumption, will address as part of BHIP treatment.  -Care Manager will provide pertinent psycho-education and resources as needed.   -Care Manager will provide brief counseling intervention including Behavioral Activation, CBT, Problem-Solving Training, Distress Tolerance, Motivation Interviewing, Stress Management, Relapse Prevention, Pain-Management skill building.  -Sleep hygiene

## 2012-05-30 ENCOUNTER — Telehealth (INDEPENDENT_AMBULATORY_CARE_PROVIDER_SITE_OTHER): Payer: Self-pay | Admitting: Psychiatry

## 2012-05-30 NOTE — Telephone Encounter (Signed)
The below treatment considerations and suggestions are based on consultation with the patient's care coordinator and a review of information available in the [facility] Mental Health Integrated Tracking System (MHITS). I have not personally examined the patient. All recommendations should be implemented with consideration of the patient's relevant prior history and current clinical status. Please feel free to contact me with any questions about the care of this patient.   [facility]) Psychiatry Consultation Note:  CC/ID: 36 yo M referred to [facility] by his [facility] PCP, Dr. Iona Hansen, for management of depression.    HPI: Pt seen by CM for initial assessment on 11/6; PHQ-9: 9, GAD-7: 8. No SI or passive death wishes noted. Pt endorsed low mood in setting of end of recent relationship and change in shift at work. Taking fluoxetine 20mg  daily which was begun on 10/29.    Past Psych Hx: No hospitalizations or suicide attempts. No prior psychotropic trials.    Family Psych Hx: Maternal GF w/ ETOH abuse vs. Dependence.    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 Family: mother/father alive, not married, 2 brothers on mom's side, brother/sister on father's side. Childhood was good, lived mostly with grandparents, mother helped as well. No hx of abuse/neglect as a child. Marital Status/Relationship:   Education: 12th grade, did not graduate. 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 Drinking escalated following relationship breakup. Denied illicits. Smokes 3-4  cigarettes per day.    Reviewed PMH, All, Current meds.    Brief Assessment: 36 yo M w/ minor depressive symptomatology in setting of recent break-up and change in shift at work. Appears more consistent w/ adjustment reaction as opposed to frank major depression.    Recs:  1. Can continue fluoxetine though could consider discontinuing.  2. Agree w/ CM plan to work w/ pt using CBT, behavioral activation, PST, as well as counseling on ETOH reduction.    Dimitry Melven Sartorius, MD, MPH  [facility] Consulting Psychiatrist

## 2012-06-11 ENCOUNTER — Ambulatory Visit (INDEPENDENT_AMBULATORY_CARE_PROVIDER_SITE_OTHER): Payer: BLUE CROSS/BLUE SHIELD | Admitting: Clinical

## 2012-06-12 ENCOUNTER — Telehealth (INDEPENDENT_AMBULATORY_CARE_PROVIDER_SITE_OTHER): Payer: Self-pay | Admitting: Clinical

## 2012-06-12 NOTE — Telephone Encounter (Signed)
Spoke with patient and rescheduled him for December 5th at 4pm. Thank you.

## 2012-06-12 NOTE — Progress Notes (Signed)
Pt did not show for scheduled BHIP appointment.

## 2012-06-12 NOTE — Telephone Encounter (Signed)
Pt did not show for scheduled appointment, SWer sending to PSR pool to reschedule.

## 2012-06-26 ENCOUNTER — Ambulatory Visit (INDEPENDENT_AMBULATORY_CARE_PROVIDER_SITE_OTHER): Payer: BLUE CROSS/BLUE SHIELD | Admitting: Clinical

## 2012-06-26 NOTE — Patient Instructions (Signed)
Please schedule next BHIP appointment for 30 minutes in 2 weeks.  You can schedule at front desk.  Thank you.

## 2012-06-26 NOTE — Progress Notes (Signed)
[name] is a 36 year oldmale who is a patient of Garrett Ward.      Previous plan:  Reviewed BHIP program, pt scored low on PHQ and GAD    Mental Status:     Summary Notes:  Medications: pt taking as prescribed prozac 20 mg/day    Pt and SWer discussed PHQ and GAD score, that counseling might be helpful, pt also stating "the medications seem to be helping but I am thinking about my drinking," and "I had someone say to me, after they were with me about two weeks 'wow, you drink a lot,' and it made me thing."  Swer asked pt if he was willing to complete the AUDIT, pt stated he was willing.    Pt completed AUDIT: 15      Pt and SWer reviewed his risk level (between risky and harmful drinking, but not dependent), pt stated he was a 7 on a scale of 1-10 RE changing his drinking "I want to be healthy, healthier now that I'm getting older," and "I don't want to become dependent of become worse," also "I've been wanting to be more active, athletic."    Pt does not exercise "not at all."  SWer and pt discussed, pt noted he had a friend who wants him to join a basket ball team, also he is thinking about working out at [employer] again.  SWer supported.     Plan:   Pt will not drink on weedays  Pt will not drink more than 4 drinks at a sitting  Pt will not drink more than 14 drinks per week  Pt wants to start playing basketball, with a friend, consider this.  SWER TO COMPLETE INTAKE AT NEXT APPOINTMENT, consider 1x/month meetings

## 2012-06-27 NOTE — Progress Notes (Signed)
Pt did not show for appointment. 

## 2012-07-10 ENCOUNTER — Ambulatory Visit (INDEPENDENT_AMBULATORY_CARE_PROVIDER_SITE_OTHER): Payer: BLUE CROSS/BLUE SHIELD | Admitting: Clinical

## 2012-07-14 ENCOUNTER — Telehealth (INDEPENDENT_AMBULATORY_CARE_PROVIDER_SITE_OTHER): Payer: Self-pay | Admitting: Clinical

## 2012-07-14 NOTE — Telephone Encounter (Signed)
Pt sched

## 2012-07-14 NOTE — Telephone Encounter (Signed)
Pt did not show for scheduled appointment, SWer requesting PSR assistance rescheduling pt for BHIP.

## 2012-07-14 NOTE — Telephone Encounter (Signed)
Main reason for the call: Called for patient and mom states she lives in [place]. Patient does not live with her. So will relay message to call clinic back to patient. 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    Call back expected: NO    Daytime call back number: [phone]    Okay to leave detailed VM or msg with someone at any of these numbers (if you are not available when the clinic staff calls back)?: NO

## 2012-08-04 ENCOUNTER — Ambulatory Visit (INDEPENDENT_AMBULATORY_CARE_PROVIDER_SITE_OTHER): Payer: BLUE CROSS/BLUE SHIELD | Admitting: Clinical

## 2012-08-04 NOTE — Patient Instructions (Signed)
Work out 2-3 per week.      Keep up plan for Alcohol, 14 or under per week and under 4 per day      Pt would like to keep meeting with SWer every two weeks

## 2012-08-04 NOTE — Progress Notes (Signed)
BHIP FOLLOW-UP NOTE      Type of Service: Psychotherapy  Duration of session: 30     CURRENT MENTAL HEALTH CONCERNS/REASON(S) FOR VISIT:   patient a 37 year old, male arrived for a follow up appointmentt with the Mental Health/Behavioral Health Integration Program (MHIP/BHIP) for adjustment disorder, ETOH abuse.    Interval History:    Pt decreasing his drinking to low harm levels (normal per AUDIT)  Pt engaged in bx activation  PHQ reduction  Pt verbalizing "feeling better."    Depression PHQ9 Calculated Score: 6    GAD7 Score:  2    Risk Assessment: Risk/Protective Factors, Safety Planning    Assessment of Progress or Regression and Expected Treatment Outcomes: Pt making progress.    DSM IV-TR Diagnosis:   Axis I: Adjustment disorder, ETOH abuse in early remission  Axis II: no  Axis III: no  Axis IV:  no  Axis V:   no    Intervention/Plan:      Previous plan:  Pt will not drink on weedays   Pt will not drink more than 4 drinks at a sitting   Pt will not drink more than 14 drinks per week   Pt wants to start playing basketball, with a friend, consider this.       Mental Status:    Summary Notes:  Medications: NOT taking as prescribed, pt has taken "maybe three times per week."  Has not taken since last Tuesday.  Discussed compliance, pt will put in backpack in car, he has it every day.    Pt quit smoking for one week, feeling good about this.  RE weekdays: pt now drinking every other day, M W of last week, feeling like he has "a little more energy."  Will not drink more than three in a sitting.  RE per week: pt stated he is drinking less than 14 drinks per week, "more like 10."  Pt stated that after last conversation he has "been cutting down."  Now is under 14 per week, "most times under 3-4 per sitting.     Also joined the [employer], playing each Tuesday.  Pt wants to start practicing "every other day."    Pt stated "I knew I was drinking too much, just trying to deal with some things," and "I knew  because people mentioned it to me, people who know me kind of brought it up."  Pt stated he feels he is finding better ways to cope with distresses, stressors in his life, also feeling more energetic and acknowledged more insight into ETOH impact on mood.      Plan:   Work out 2-3 per week.  Keep up plan for ETOH, 14 or under per week and under 4 per day  Pt would like to keep meeting with SWer every two weeks      Care Coordinator discussed Behavioral Health Integration Program (BHIP) and treatment alternatives.   Stabilization of the adjustment disorder, ETOH abuse with psychiatric medication   Counseling interventions: Behavioral Activation, Motivational Interviewing and ETOH reduction MI   Substance use interventions (if applicable): MI for ETOH reduction   Patient will follow-up with: Social Worker in 14 Days  Other follow-up:  None  Referrals:  none    The patient is able to participate in treatment and consents to the BHIP treatment plan.

## 2012-08-18 ENCOUNTER — Encounter (INDEPENDENT_AMBULATORY_CARE_PROVIDER_SITE_OTHER): Payer: BLUE CROSS/BLUE SHIELD | Admitting: Clinical

## 2012-08-18 ENCOUNTER — Telehealth (INDEPENDENT_AMBULATORY_CARE_PROVIDER_SITE_OTHER): Payer: Self-pay | Admitting: Clinical

## 2012-08-18 NOTE — Telephone Encounter (Signed)
Pt did not show for scheduled appointment, SWer requesting PSR assistance rescheduling.

## 2012-08-18 NOTE — Progress Notes (Signed)
Pt did not show for scheduled appointment.  

## 2012-08-18 NOTE — Telephone Encounter (Signed)
Main reason for the call: Spoke to patient to offer an appointment with Vonna Kotyk since missed appointment today. On the appointment for 02/04 @ 11:30am with Vonna Kotyk. Thanks and routing to Social Work.     Call back expected: NO    Daytime call back number: [phone]    Okay to leave detailed VM or msg with someone at any of these numbers (if you are not available when the clinic staff calls back)?: NO

## 2012-08-26 ENCOUNTER — Ambulatory Visit (INDEPENDENT_AMBULATORY_CARE_PROVIDER_SITE_OTHER): Payer: BLUE CROSS/BLUE SHIELD | Admitting: Clinical

## 2012-08-26 NOTE — Progress Notes (Signed)
BHIP FOLLOW-UP NOTE      Type of Service: Psychotherapy  Duration of session: 30     CURRENT MENTAL HEALTH CONCERNS/REASON(S) FOR VISIT:   patient a 37 year old, male arrived for a follow up appointmentt with the Mental Health/Behavioral Health Integration Program (MHIP/BHIP) for adjustment disorder, ETOH abuse.    Interval History:    Pt has cut down on drinking by pt report  Pt engaged in bx activation, making progress  Pt taking medication as prescribed  Pt engaged in treatment    Depression PHQ9 Calculated Score: 3    GAD7 Score:  3    Risk Assessment: Risk/Protective Factors, Safety Planning    Assessment of Progress or Regression and Expected Treatment Outcomes: Pt making strong progress on all goals    DSM IV-TR Diagnosis:   Axis I: adjustment disorder, Alcohol Abuse  Axis II: none  Axis III: none  Axis IV:  none  Axis V:   none    Intervention/Plan:      Previous plan:  Work out 2-3 per week.   Keep up plan for ETOH, 14 or under per week and under 4 per day   Pt would like to keep meeting with SWer every two weeks    Mental Status:    Summary Notes:  Medications: Pt is taking Prozac, some miss doses, "when I miss it I feel a little edgy."    Pt has not smoked for over one month.  Pt continuing to work out 2-3 times per week, is taking part in basketball league with friends.     Re ETOH, pt "kind of partied in the last couple days," due to [employer] [facility] game/win.  Pt feels he is having about 3 drinks every other day, "not every day."  Pt and Swer discussed drinking, quantities and how much ETOH in each drink, monitoring.    Pt has recently gotten into a new relationship, she has moved in with him, living with her niece (81 y/o) - "it's quite an adjustment."   Pt discussed "I want to be there for her, but I know I need to be there for myself as well," wants to talk about this in meetings.    Plan:   Pt gave pt handout on CBT for anxiety  Continue to discuss new relationship and "trying to do things differently."        Care Coordinator discussed Behavioral Health Integration Program [facility]) and treatment alternatives.   Stabilization of the adjustment disorder with psychiatric medication   Counseling interventions: Behavioral Activation and Motivational Interviewing   Substance use interventions (if applicable): Pt working on reducing ETOH consumption to low risk from risky, currently pt appears low risk  Patient will follow-up with: Child psychotherapist in 14 Days  Other follow-up:  None  Referrals:  None    The patient is able to participate in treatment and consents to the BHIP treatment plan.

## 2012-09-11 ENCOUNTER — Ambulatory Visit (INDEPENDENT_AMBULATORY_CARE_PROVIDER_SITE_OTHER): Payer: BLUE CROSS/BLUE SHIELD | Admitting: Clinical

## 2012-09-11 NOTE — Progress Notes (Signed)
BHIP FOLLOW-UP NOTE      Type of Service: Psychotherapy  Duration of session: 30     CURRENT MENTAL HEALTH CONCERNS/REASON(S) FOR VISIT:   patient a 37 year old, male arrived for a follow up appointmentt with the Mental Health/Behavioral Health Integration Program (MHIP/BHIP) for Adjustment disorder.    Interval History:    PT engaged in bx activation  Pt working on cutting down ETOH  Pt taking part in counseling    Depression PHQ9 Calculated Score: 2    GAD7 Score:  2    Risk Assessment: Risk/Protective Factors, Safety Planning    Assessment of Progress or Regression and Expected Treatment Outcomes: Pt making progress toward goals.    DSM IV-TR Diagnosis:   Axis I: Adjustment disorder  Axis II: deferred  Axis III: deferred  Axis IV:  deferred  Axis V:   deferred    Intervention/Plan:      Previous plan:  Pt gave pt handout on CBT for anxiety   Continue to discuss new relationship and "trying to do things differently."     Mental Status: WNL, tired, just off work.    Summary Notes:  Medications:  Taking "on and off," ran out of medication yesterday, over the last 3 weeks 3-4 missed.    Exercise: playing basketball once per week.  Wants to increase to three times per week: join gym, running (used to run once per day 15 years ago).    Pt has had a couple cigarettes this week, had previously quit - will try to quit again.     Relationship is going well with partner, good communication.    RE ETOH: 12-13 drinks per week - shots of ETOH, maybe drinking more that 14 at this point, aiming at 14/week, NOT drinking more than 4 per day.     Plan:   Increase exercise from once to 3x/week  Monitor amount of ETOH in each "drink."  Will review CBT for Anxiety    Care Coordinator discussed Behavioral Health Integration Program [facility]) and treatment alternatives.   Stabilization of the adjustment disorder with psychiatric medication   Counseling interventions: Behavioral Activation and Motivational Interviewing   Substance use  interventions (if applicable): pt cutting back on drinking   Patient will follow-up with: Social Worker in 14 Days  Other follow-up:  No  Referrals:  none    The patient is able to participate in treatment and consents to the BHIP treatment plan.

## 2012-09-12 ENCOUNTER — Ambulatory Visit (INDEPENDENT_AMBULATORY_CARE_PROVIDER_SITE_OTHER): Payer: BLUE CROSS/BLUE SHIELD | Admitting: Family Medicine

## 2012-09-12 ENCOUNTER — Encounter (INDEPENDENT_AMBULATORY_CARE_PROVIDER_SITE_OTHER): Payer: Self-pay | Admitting: Family Medicine

## 2012-09-12 VITALS — BP 141/84 | Temp 98.6°F | Wt 171.8 lb

## 2012-09-12 MED ORDER — FLUOXETINE HCL 20 MG OR TABS
ORAL_TABLET | ORAL | Status: DC
Start: 2012-09-12 — End: 2013-06-01

## 2012-09-12 MED ORDER — VARENICLINE TARTRATE (STARTER) 0.5 MG X 11 & 1 MG X 42 OR TBPK
ORAL_TABLET | ORAL | Status: AC
Start: 2012-09-12 — End: 2012-10-12

## 2012-09-12 NOTE — Progress Notes (Signed)
Patient is here to follow up      He needs refills   They are pended and the pharmacy is updated     Can he get more than 1 month?    GAD-7 (Generalized Anxiety Disorder screening tool)  How often during the past 2 weeks have you felt bothered by:  0 = not at all  1 = several days  2 = more than half the days  3 = nearly everyday   1. Feeling nervous, anxious, or on edge  1   2. Not being able to stop or control worrying  1   3. Worrying too much about different things  0   4. Trouble relaxing  0   5. Being so restless that it is hard to sit still  0   6. Becoming easily annoyed or irritable  1   7. Feeling afraid as if something awful might happen  0                                          TOTAL:   3   If you checked off any problems, how difficult have these problems made it for you to do your work, take care of things at home, or get along with other people? somewhat difficult    Scoring: Add the results for question number one through seven to get a total score.  If you score 10 or above you might want to consider one or more of the following: discuss your symptoms with your doctor, contact a local mental health care provider or contact my office for further assessment and possible treatment. Although these questions serve as a useful guide, only an appropriate licensed health professional can make the diagnosis of Generalized Anxiety Disorder.

## 2012-09-12 NOTE — Patient Instructions (Signed)
[employer] is a great online tool to help you quit!       [place] Q[place]:  1-800-QUIT-NOW      Smoking: Ways to Quit   Know why you want to quit.   When you quit smoking, your body gets to work repairing damaged tissues. Here are some of the health benefits:    You stop the destruction of your lungs.    You improve the blood flow to your heart, brain, and other body organs.    It's easier to fight colds and other respiratory infections.    You decrease your risk of cancer, heart disease, stroke, and circulation problems.    You will protect your family and friends from the dangers of your secondhand smoke.   In addition, when you quit you will:    Feel more in control of your life.    Have better smelling hair, breath, clothes, home, and car.    Stop the premature wrinkles smoking causes on your face.    Have more stamina for activities.    Save money.   Smoking is an addictive habit. Most former smokers make several attempts to quit before they finally succeed. So, never say, "I can't." Just keep trying.     WRITE DOWN YOUR OWN REASONS FOR QUITTING AND KEEP THEM NEARBY - LOOK AT THEM WHEN YOU WANT TO LIGHT UP AGAIN  Set a quit date.   Set a date for when you will stop smoking. Don't buy cigarettes to carry you beyond your last day. Tell your family and friends you plan to quit, and ask for their support and encouragement. Ask them not to offer you cigarettes.   Make a plan.   5 Days Before Your Quit Date    Think about your reasons for quitting.    Tell your friends and family you are planning to quit.    Stop buying cigarettes.   4 Days Before Your Quit Date    Pay attention to when and why you smoke.    Think of other things to hold in your hand instead of a cigarette.    Think of habits or routines to change.   3 Days Before Your Quit Date    Plan what you will do with the extra money when you stop buying cigarettes.    Think of people you can reach out to when you need help.   2 Days  Before Your Quit Date    Consider buying nonprescription nicotine patches or nicotine gum. Or see your healthcare provider to get a prescription for the nicotine inhaler, nasal spray, or other medicine that can help.   1 Day Before Your Quit Date    Put away lighters and ashtrays.    Throw away all cigarettes and matches--no emergency stashes are allowed!    Clean your clothes to get rid of the smell of cigarette smoke.   Quit Day    Keep very busy.    Remind family and friends that this is your quit day.    Stay away from alcohol.    Stay away from places where you used to smoke and people you used to smoke with. For example, if you're used to smoking while driving in your car: walk, take the bus, or ask a nonsmoking friend for a ride. If you're used to taking a 15-minute smoke break every half-day, do something healthy like taking a walk or planning a healthy next meal.    Start   a Quit Journal and write how you are feeling.    Give yourself a treat or do something else special.   Commit to staying quit.   Make sure that all your cigarettes and ashtrays are thrown away.   If you keep cigarettes or ashtrays around, sooner or later you'll break down and smoke one, then another, then another, and so on. Throw them away. Make it hard to start again.   Because you are used to having something in your mouth, you may want to chew gum as a substitute for smoking. Or munch on carrots or celery.   Spend time with nonsmokers rather than with smokers.   Think of yourself as a nonsmoker. Tell other people that you are a nonsmoker (for example, in restaurants). Stay away from places where there are a lot of smokers, such as bars. Avoid spending time with smokers. You can't tell others not to smoke, but you don't have to sit with them while they do. Plan on walking away from cigarette smoke. Spend time with nonsmokers and sit in the nonsmoking section of restaurants.   Be prepared for relapse or difficult situations.    Most people who go back to smoking cigarettes do so within the first 3 months after quitting. Many people try 5 or more times before they successfully quit. Avoid drinking alcohol, because it lowers your chances of success. Don't be distracted by the weight you may gain after quitting. Smokers usually do not gain more than 10 pounds when they stop smoking. Learn new ways to improve your mood and overcome depression.   Start an exercise program.   As you become more fit, you will not want the nicotine effects in your body. Regular exercise will help keep you from gaining weight. It can also help you feel less depressed if you have mild depression.   Keep your hands busy.   You may not know what to do with your hands for a while. Try reading, making repairs, knitting, needlework, pottery, drawing, making a plastic model, or doing a jigsaw puzzle. Join special interest groups that keep you involved in your new hobbies.   Take on new activities.   Change your routine. Take on new activities that don't include smoking. Join an exercise group and work out regularly. Sign up for an evening class or join a book club or study group at your place of worship. Go on more outings with your family or friends. Volunteer in your community. Learn ways to relax and manage stress.   Join a quit-smoking program.   Some people do better in groups, or with a set of instructions to follow. That's fine, too. Remember, the goal is to quit smoking. It doesn't matter what method you choose, just as long as it works for you. Research has shown that participating in a program that provides support--either one-on-one or group support--increases your chances of successfully quitting.   Consider using medicine to help you quit.   Quitting smoking is a two-step process. You need to break the addiction to nicotine, the drug that is in tobacco, and you need to break the smoking habit. Nicotine replacement therapy helps take care of the nicotine  addiction so that you can focus on breaking the habit of smoking.   You can use nicotine patches or gum, available without a prescription at your local pharmacy, to help you quit smoking. Other forms of nicotine are available with a prescription from your healthcare provider:      nicotine inhaler    nicotine lozenge    nicotine nasal spray    nicotine patch.   Your healthcare provider can prescribe other medicines--Zyban or Chantix--to help you quit.    Zyban (bupropion HCL) is used for stopping smoking and for depression. It works best if you start taking it about 2 weeks before your quit date.    Chantix (varenicline) is a newer medicine developed specifically to help people stop smoking. You should start it 1 week before your quit date. It appears to be very effective in helping people stop smoking.   Zyban and Chantix do not contain nicotine, but they reduce your craving for nicotine while you are quitting. Ask your healthcare provider if you are a candidate for either of these medicines.   Quitting can be hard work, but you can learn to live without cigarettes in your daily life.   Developed by Doreene Nest, MD.   Published by [employer].   Last modified: 2008-03-05   Last reviewed: 2007-09-29   This content is reviewed periodically and is subject to change as new health information becomes available. The information is intended to inform and educate and is not a replacement for medical evaluation, advice, diagnosis or treatment by a healthcare professional.   [facility] 2009.3 Index  Women's Health Advisor 2009.3 Credits    2009 [employer] and/or its affiliates. All Rights Reserved.              Varenicline, Oral   [id number]   What are other names for this medicine?   Type of medicine: smoking deterrent   Generic and brand names: varenicline, oral; Chantix   What is this medicine used for?   This product is taken by mouth to help you stop smoking.   What should my healthcare provider  know before I take this medicine?   Before taking this medicine, tell your healthcare provider if you have ever had:    an allergic reaction to any medicine   kidney disease   mental disorders such as bipolar disorder, depression, or schizophrenia  Females of childbearing age: Talk with your healthcare provider if you are pregnant or plan to become pregnant. It is not known whether this medicine will harm an unborn baby. Do not breast-feed while taking this medicine without your healthcare provider's approval.   How do I use it?   Read the Medication Guide that comes in the medicine package when you start taking this medicine and each time you get a refill.   Check the label on the medicine for directions about your specific dose. Take this medicine exactly as your healthcare provider prescribes. Taking too much may increase the risk of side effects. Do not stop taking this medicine without your healthcare provider's approval. Treatment with this medicine usually lasts for 12 weeks, but may take longer depending on your response.   This medicine is started while you are still smoking because you need to take it for 1 week to build an effective level of medicine in your blood. Set a target date to stop smoking 1 week after you start taking the medicine.   Do not take nicotine replacement medicines without your healthcare provider's approval. Taking this medicine with nicotine replacement medicines may increase your risk of side effects.   Take this medicine with a full glass of water. Take it after meals to lessen the chance the drug will upset your stomach.   An instruction sheet is included in the package of  this medicine. Read the information carefully. Talk to your healthcare provider or pharmacist if you do not understand all of the instructions.   What if I miss a dose?   If you miss a dose, take it as soon as you remember unless it is almost time for the next scheduled dose. In that case, skip the missed  dose and take the next one as directed. Do not take double doses. If you are not sure of what to do if you miss a dose, or if you miss more than one dose, contact your healthcare provider.   What if I overdose?   Symptoms of an acute overdose have not been reported.   What should I watch out for?   Behavior changes may be caused by the medicine or by quitting smoking. Contact your provider right away if you or your family notice any disturbing changes in your thoughts or behavior, such as:    more outgoing or aggressive behavior than normal   hallucinations   depression or suicidal thoughts  This medicine may cause dizziness, confusion, or weakness. Do not drive or operate machinery unless you are fully alert and know how this medicine affects you.   This medicine may cause nausea and trouble sleeping. These side effects usually go away. If these side effects continue or become worse, contact your healthcare provider. Your healthcare provider may reduce the dose of the medicine.   When you stop smoking, there may be a change in how certain medicines work for you. Talk to your healthcare provider if you are taking medicine that reduces the chance of blood clots forming such as warfarin (Coumadin), bronchodilators such as theophylline, or diabetes medicines such as insulin.   What are the possible side effects?   Along with its needed effects, your medicine may cause some unwanted side effects. Some side effects may be very serious. Some side effects may go away as your body adjusts to the medicine. Tell your healthcare provider if you have any side effects that continue or get worse.   Life-threatening (Report these to your healthcare provider right away. If you cannot reach your healthcare provider right away, get emergency medical care or call 911 for help): Allergic reaction (hives; itching; rash; trouble breathing; tightness in your chest; swelling of your lips, tongue, and throat).   Serious (report these to  your healthcare provider right away): Change in behavior or thoughts, confusion, hallucinations, depression, thoughts of suicide, any rash, peeling or blistering sores, mouth sores, nausea or trouble sleeping that doesn't go away.   Other: Nausea, trouble sleeping, headache, constipation, gas, vomiting, abnormal dreams, drowsiness, dizziness, tiredness, weakness, change in sense of taste, increased appetite, dry mouth.   What products might interact with this medicine?   No significant drug interactions have been reported.   Keep a list of all your medicines with you. List all the prescription medicines, nonprescription medicines, supplements, natural remedies, and vitamins that you take. Be sure that you tell all healthcare providers who treat you about all the products you are taking.   How should I store this medicine?   Store this medicine at room temperature. Protect it from heat, high humidity, and bright light.     This advisory includes selected information only and may not include all side effects of this medicine or interactions with other medicines. Ask your healthcare provider or pharmacist for more information or if you have any questions.   Ask your pharmacist for the best way to  dispose of outdated medicine or medicine you have not used. Do not throw medicine in the trash.   Keep all medicines out of the reach of children.   Do not share medicines with other people.   Developed by [employer].   Published by [employer].   Last modified: 2008-05-19   Last reviewed: 2008-05-06   This content is reviewed periodically and is subject to change as new health information becomes available. The information is intended to inform and educate and is not a replacement for medical evaluation, advice, diagnosis or treatment by a healthcare professional.   Medication Advisor [phone] Index   2009 [employer] and/or its affiliates. All Rights Reserved.

## 2012-09-12 NOTE — Progress Notes (Signed)
SUBJECTIVE:  Garrett Ward is an 37 year old male who presents in follow up for depression.  See visit of 05/20/12 and 05/30/12 for details of prior evaluation.       Any significant life changes since last visit? None  Depression PHQ9 Calculated Score: 3 (12 on intake)  GAD7: 3  CAGE questions negative.    Current treatment modalities include: individual therapy and medication  Patient rates the efficacy of antidepressant medication as excellent  If in counseling, where is patient receiving counseling? BHIP  he rates the efficacy of counseling or psychotherapy as good    Outpatient Prescriptions Prior to Visit   Medication Sig Dispense Refill   . ALBUTEROL SULFATE (PROAIR HFA) 108 (90 BASE) MCG/ACT Inhalation Aero Soln 2 puffs inhaled every 4 hours as needed for asthma; may use every 2 hours for attacks, or preventively before exercise  1 Inhaler  2   . FLUoxetine HCl 20 MG Oral Tab 1 tab orally each day  30 Tab  1   . Prochlorperazine Maleate 10 MG Oral Tab 1 tab for severe headache  6 Tab  0   . SUMAtriptan Succinate 25 MG Oral Tab 1 TABLET 1 TIME ONLY AT ONSET OF MIGRAINE,REPEAT EVERY 2 HR AS NEEDED up to 4 tabs daily  12 Tab  0     No facility-administered medications prior to visit.        OBJECTIVE:  BP 141/84  Temp(Src) 98.6 F (37 C) (Temporal)  Wt 171 lb 12 oz (77.905 kg)  SpO2 100%   MENTAL STATUS EXAMINATION:   MOOD: euthymic   AFFECT: normal affect   THOUGHT PROCESSES: normal: logical, coherent, goal-directed.  ORIENTATION: intact to person, place and time      ASSESSMENT/ PLAN:   (296.20) Major depression, single episode  (primary encounter diagnosis)  Plan: FLUoxetine HCl 20 MG Oral Tab    (305.1) Tobacco abuse  Plan: Varenicline Tartrate (CHANTIX STARTING MONTH         PAK) 0.5 MG X 11 & 1 MG X 42 Oral Misc    I spent 30 minutes face to face with the patient with > 50% in counseling regarding depression, medications, substance use, smoking cessation, treatment plans, medication side effects  and alternatives as documented here and in patient instructions, as well as answering his questions.

## 2012-09-16 ENCOUNTER — Other Ambulatory Visit: Payer: Self-pay | Admitting: Pharmacist

## 2012-09-16 MED ORDER — VARENICLINE TARTRATE 1 MG OR TABS
ORAL_TABLET | ORAL | Status: DC
Start: 2012-09-16 — End: 2013-06-01

## 2012-09-16 NOTE — Telephone Encounter (Signed)
This medication is outside of the [facility]'s protocols. Please sign and close the encounter if you approve:  Chantix starting month pak was prescribed on 09/12/12 with one refill.  Pharmacy would like to clarify if you would like patient to continue on with Continue pack which contains 1mg  bid dosing after the starting month Pak instead of a refill on starting month pak?     If this medication is denied please have your staff inform the patient and schedule an appointment if necessary.

## 2012-09-29 ENCOUNTER — Ambulatory Visit (INDEPENDENT_AMBULATORY_CARE_PROVIDER_SITE_OTHER): Payer: BLUE CROSS/BLUE SHIELD | Admitting: Clinical

## 2012-10-07 ENCOUNTER — Ambulatory Visit (INDEPENDENT_AMBULATORY_CARE_PROVIDER_SITE_OTHER): Payer: BLUE CROSS/BLUE SHIELD | Admitting: Clinical

## 2012-10-08 NOTE — Progress Notes (Signed)
SWer had to cancel appointment due to other pt crisis.

## 2012-10-15 ENCOUNTER — Encounter (INDEPENDENT_AMBULATORY_CARE_PROVIDER_SITE_OTHER): Payer: BLUE CROSS/BLUE SHIELD | Admitting: Clinical

## 2012-10-16 ENCOUNTER — Telehealth (INDEPENDENT_AMBULATORY_CARE_PROVIDER_SITE_OTHER): Payer: Self-pay | Admitting: Clinical

## 2012-10-16 NOTE — Progress Notes (Signed)
No show

## 2012-10-16 NOTE — Telephone Encounter (Signed)
Sending to [facility] team to help with rescheduling.

## 2012-10-16 NOTE — Telephone Encounter (Signed)
Apt sch 10-23-12

## 2012-10-23 ENCOUNTER — Ambulatory Visit (INDEPENDENT_AMBULATORY_CARE_PROVIDER_SITE_OTHER): Payer: Self-pay | Admitting: Clinical

## 2012-10-27 NOTE — Progress Notes (Signed)
Pt did not show for scheduled appointment.  

## 2013-06-01 ENCOUNTER — Ambulatory Visit (INDEPENDENT_AMBULATORY_CARE_PROVIDER_SITE_OTHER): Payer: BLUE CROSS/BLUE SHIELD | Admitting: Family Medicine

## 2013-06-01 ENCOUNTER — Encounter (INDEPENDENT_AMBULATORY_CARE_PROVIDER_SITE_OTHER): Payer: Self-pay | Admitting: Family Medicine

## 2013-06-01 VITALS — BP 132/87 | HR 71 | Temp 97.8°F | Resp 18 | Ht 72.0 in | Wt 192.4 lb

## 2013-06-01 LAB — COMPREHENSIVE METABOLIC PANEL
ALT (GPT): 15 U/L (ref 10–64)
AST (GOT): 21 U/L (ref 15–40)
Albumin: 4.6 g/dL (ref 3.5–5.2)
Alkaline Phosphatase (Total): 42 U/L (ref 36–122)
Anion Gap: 7 (ref 3–11)
Bilirubin (Total): 0.6 mg/dL (ref 0.2–1.3)
Calcium: 9.6 mg/dL (ref 8.9–10.2)
Carbon Dioxide, Total: 27 mEq/L (ref 22–32)
Chloride: 104 mEq/L (ref 98–108)
Creatinine: 0.95 mg/dL (ref 0.51–1.18)
GFR, Calc, African American: >60 mL/min (ref >59)
GFR, Calc, European American: >60 mL/min (ref >59)
Glucose: 88 mg/dL (ref 62–125)
Potassium: 4.2 mEq/L (ref 3.7–5.2)
Protein (Total): 7.6 g/dL (ref 6.0–8.2)
Sodium: 138 mEq/L (ref 136–145)
Urea Nitrogen: 8 mg/dL (ref 8–21)

## 2013-06-01 LAB — LIPID PANEL
Cholesterol (LDL): 158 mg/dL — ABNORMAL HIGH (ref <130)
Cholesterol/HDL Ratio: 4.4
HDL Cholesterol: 50 mg/dL (ref >40)
Non-HDL Cholesterol: 171 mg/dL — ABNORMAL HIGH (ref 0–159)
Total Cholesterol: 221 mg/dL — ABNORMAL HIGH (ref <200)
Triglyceride: 65 mg/dL (ref <150)

## 2013-06-01 MED ORDER — ALBUTEROL SULFATE HFA 108 (90 BASE) MCG/ACT IN AERS
2.0000 | INHALATION_SPRAY | RESPIRATORY_TRACT | Status: AC | PRN
Start: 2013-06-01 — End: ?

## 2013-06-01 MED ORDER — FLUOXETINE HCL 20 MG OR TABS
20.0000 mg | ORAL_TABLET | Freq: Every day | ORAL | Status: AC
Start: 2013-06-01 — End: ?

## 2013-06-01 NOTE — Patient Instructions (Addendum)
1. Asthma, mild intermittent, uncomplicated  - Albuterol Sulfate HFA (PROAIR HFA) 108 (90 BASE) MCG/ACT Inhalation Aero Soln; Inhale 2 puffs by mouth every 4 hours as needed. For Asthma. May use every 2 hours for attacks, or pereventively before exercise.  Dispense: 1 Inhaler; Refill: 11    3. Weight gain  - LIPID PANEL  - COMPREHENSIVE METABOLIC PANEL  If you continue to gain weight or have increase in abdominal pain, then return for additional testing    4. Routine general medical examination at a health care facility  - LIPID PANEL  - HIV ANTIGEN AND ANTIBODY SCRN  - GC&CHLAM NUCLEIC ACID DETECTN  - SEROLOGIC SYPHILIS PANEL, SRM  - TRICHOMONAS NUCLEIC ACID    5. Major depression, single episode  - FLUoxetine HCl 20 MG Oral Tab; Take 1 tablet (20 mg) by mouth daily.  Dispense: 90 tablet; Refill: 2

## 2013-06-01 NOTE — Progress Notes (Signed)
SUBJECTIVE:    Garrett Ward is a 37 year old male here for a wellness/preventive health care visit.  Additional concerns:     Current dietary habits: healthy diet in general  Current exercise habits: decrease in activity level  Regular seat belt use: yes  Guns in the house: Not asked today  History sections of chart reviewed and updated today: yes    Patient Active Problem List    Diagnosis Date Noted   . Asthma [493.90]        Current Outpatient Prescriptions   Medication Sig   . ALBUTEROL SULFATE (PROAIR HFA) 108 (90 BASE) MCG/ACT Inhalation Aero Soln 2 puffs inhaled every 4 hours as needed for asthma; may use every 2 hours for attacks, or preventively before exercise   . FLUoxetine HCl 20 MG Oral Tab 1 tab orally each day     No current facility-administered medications for this visit.        REVIEW OF SYSTEMS:  CONSTITUTIONAL: Denies, fatigue, unexpected weight loss, unexpected weight gain  NEUROLOGIC: Denies, headaches and tremors  OPHTHALMIC: Denies, any vision problems, blurry vision, diplopia, eye pain  ENT: Denies and any ear nose or throat problems  RESPIRATORY: Denies, dyspnea on exertion, dyspnea at rest, cough, wheezing  CARDIOVASCULAR: Denies, chest pain or pressure at rest or during exercise, palpitations  GI: Denies, change in appetite, dysphagia, nausea, vomiting, dyspepsia, abdominal pain, diarrhea, constipation, hematochezia, melena  GENITOURINARY: Denies, urethral discharge, erectile dysfunction, dysuria, any genito-urinary problems  INTEGUMENTARY: Denies, changes in moles or new moles, any skin problems  RHEUMATOLOGIC/MSK: Denies, joint swelling, joint stiffness, any joint or arthritic problems  ENDOCRINE: Denies, polyuria, polydipsia, heat intolerance, cold intolerance  PSYCHOSOCIAL: Denies, depressed mood, sleep disturbance and anxiety    SEXUAL HISTORY  Sexual activity: yes  Sexual concerns: No    PHYSICAL EXAM:  BP 132/87  Pulse 71  Temp(Src) 97.8 F (36.6 C) (Temporal)  Resp  18  Ht 6' (1.829 m)  Wt 192 lb 6.4 oz (87.272 kg)  BMI 26.09 kg/m2  SpO2 99%   General: no acute distress  Skin: Skin color, texture, turgor normal. No rashes or concerning lesions  Head: Normocephalic. No masses, lesions, tenderness or abnormalities  Eyes: Lids/periorbital skin normal, Conjunctivae/corneas clear, PERRL, EOM's intact  Ears: External ears normal. Canals clear. TM's normal.  Nose: normal  Oropharynx: normal  Teeth: normal dentition for age  Neck: supple. No adenopathy. Thyroid symmetric, normal size, without nodules  Lungs: clear to auscultation  Heart: normal rate, regular rhythm and no murmurs, clicks, or gallops  Abd: soft, non-tender. BS normal. No masses or organomegaly  GU: deferred  Rectal: deferred  Prostate: deferred  Spine: Back symmetric, no deformity; ROM normal; No CVA tenderness.  Ext: Normal, without deformities, edema, or skin discoloration, radial and DP pulses 2+ bilaterally  Neuro:  Grossly normal to observation, gait normal  -----------------------------------------    ASSESSMENT AND PLAN:  Health Maintenance   Topic Date Due   . Cholesterol- Male  01/13/2012   . Tetanus Booster  05/20/2022   . Pertussis Vaccine >11 Yrs  Completed     Immunizations or studies due: None  HIV screening offered: yes  HPV vaccine offered: N/A    Assessment and Plan    1. Asthma, mild intermittent, uncomplicated  - Albuterol Sulfate HFA (PROAIR HFA) 108 (90 BASE) MCG/ACT Inhalation Aero Soln; Inhale 2 puffs by mouth every 4 hours as needed. For Asthma. May use every 2 hours for attacks, or pereventively  before exercise.  Dispense: 1 Inhaler; Refill: 11    2. URI, acute      3. Weight gain    - LIPID PANEL  - COMPREHENSIVE METABOLIC PANEL    4. Routine general medical examination at a health care facility    - LIPID PANEL  - HIV ANTIGEN AND ANTIBODY SCRN  - GC&CHLAM NUCLEIC ACID DETECTN  - SEROLOGIC SYPHILIS PANEL, SRM  - TRICHOMONAS NUCLEIC ACID    5. Major depression, single episode    - FLUoxetine  HCl 20 MG Oral Tab; Take 1 tablet (20 mg) by mouth daily.  Dispense: 90 tablet; Refill: 2        Preventive counseling: STD prevention, healthy dietary guidelines, proper exercise

## 2013-06-01 NOTE — Progress Notes (Signed)
HM due:    Cholesterol

## 2013-06-02 ENCOUNTER — Telehealth (INDEPENDENT_AMBULATORY_CARE_PROVIDER_SITE_OTHER): Payer: Self-pay | Admitting: Family Medicine

## 2013-06-02 ENCOUNTER — Other Ambulatory Visit (INDEPENDENT_AMBULATORY_CARE_PROVIDER_SITE_OTHER): Payer: Self-pay | Admitting: Family Medicine

## 2013-06-02 LAB — GC&CHLAM NUCLEIC ACID DETECTN
Chlam Trachomatis Nucleic Acid: POSITIVE — AB
N.Gonorrhoeae(GC) Nucleic Acid: NEGATIVE

## 2013-06-02 NOTE — Telephone Encounter (Signed)
Accounting of disclosure/ HIPPA encounter created for patient.  Pt lab results from 06/01/13 are positive for Chlamydia

## 2013-06-03 LAB — SEROLOGIC SYPHILIS PANEL, SRM
Syphilis Igg Ab Result: NEGATIVE
Syphilis Screening Status: NEGATIVE

## 2013-06-03 LAB — HIV ANTIGEN AND ANTIBODY SCRN
HIV Antigen and Antibody Interpretation: NONREACTIVE
HIV Antigen and Antibody Result: NONREACTIVE

## 2013-06-03 LAB — TRICHOMONAS NUCLEIC ACID: Trichomonas Nucleic Acid Res.: NEGATIVE

## 2013-06-03 MED ORDER — AZITHROMYCIN 250 MG OR TABS
1000.0000 mg | ORAL_TABLET | Freq: Once | ORAL | Status: AC
Start: 2013-06-03 — End: 2013-06-03

## 2013-06-03 NOTE — Addendum Note (Signed)
Addended by: Vivia Budge on: 06/03/2013 01:09 PM     Modules accepted: Orders

## 2013-06-03 NOTE — Telephone Encounter (Signed)
Form also faxed to Public [facility] at [phone]. Both copy and fax confirmation left in scan pile to be scanned.

## 2013-06-03 NOTE — Telephone Encounter (Signed)
STD form completed and signed by Dr. Iona Hansen. Pt already informed and antibx prescribed by Dr. Iona Hansen.  Form gave back to lab.

## 2013-06-15 ENCOUNTER — Telehealth (INDEPENDENT_AMBULATORY_CARE_PROVIDER_SITE_OTHER): Payer: Self-pay | Admitting: Family Medicine

## 2013-06-15 NOTE — Telephone Encounter (Signed)
Received fax/forms from [facility] re; requesting pt's collaboration metrics from last PE 06/01/2013.    Form completed by Dr. Iona Hansen. Pt said he wasn't aware that his insurance requested for this but it's okay to submit to them(only lipid panel enclosed in the form).    Left a msg on [employer]) to call us back with the fax number for [place] to fax this form back to them.    Awaiting for fax number. Forms left in Chau's folder.

## 2013-06-17 NOTE — Telephone Encounter (Signed)
Left a msg again to call back with fax/address to submit this form.

## 2013-06-22 NOTE — Telephone Encounter (Signed)
Left a msg on Garrett Ward's recorder to call us back with fax number for [place] to fax the form to them. The form also doesn't have the address to mail back.

## 2013-06-22 NOTE — Telephone Encounter (Signed)
Garrett Ward with [employer].  PHN:  [id number], okay to leave a detailed vm if needed.  Message for clinic:  Does not needs  Fax back.  Wants clinic to know no metrics are being requested.  Please look at the comments section which is the what he needs follow up on.        Thank  You

## 2013-06-22 NOTE — Telephone Encounter (Signed)
Comments on form:  "Pt identified as being a tobacco user. They haven't been able to reach him re; the program."    Confirmed with pt, he was a former smoker and have quit.  Shred the form and closed encounter.

## 2014-04-29 ENCOUNTER — Encounter (INDEPENDENT_AMBULATORY_CARE_PROVIDER_SITE_OTHER): Payer: Self-pay | Admitting: Family Medicine

## 2014-04-29 ENCOUNTER — Ambulatory Visit (INDEPENDENT_AMBULATORY_CARE_PROVIDER_SITE_OTHER): Payer: BLUE CROSS/BLUE SHIELD | Admitting: Family Medicine

## 2014-04-29 VITALS — BP 128/84 | HR 60 | Temp 98.4°F | Resp 16 | Wt 197.4 lb

## 2014-04-29 DIAGNOSIS — R319 Hematuria, unspecified: Secondary | ICD-10-CM

## 2014-04-29 DIAGNOSIS — R300 Dysuria: Secondary | ICD-10-CM

## 2014-04-29 LAB — PR U/A AUTO DIPSTICK ONLY, ONSITE
Bilirubin, Urine: NEGATIVE
Glucose, Urine: NEGATIVE mg/dL
Ketones, URN: NEGATIVE mg/dL
Leukocytes: NEGATIVE
Nitrite, URN: NEGATIVE
Occult Blood, URN: NEGATIVE
Protein: NEGATIVE mg/dL
Specific Gravity, Urine: 1.02 (ref 1.005–1.030)
Urobilinogen, URN: 1 E.U./dL (ref 0.2–1.0)
pH, URN: 7 (ref 5.0–8.0)

## 2014-04-29 NOTE — Progress Notes (Signed)
Garrett Ward is a 38 year old male here for concern for blood in the urine   Duration: started yesterday  Location: from the penis  Quality: bright red at the beginning   Timing: it is getting better  Severity: just a little bit   Context: no recent sexual activity  Modifying factors: has not done anything for this  Associated Signs and Symptoms: no fever or chills, no pain or tenderness     Outpatient Prescriptions Prior to Visit   Medication Sig Dispense Refill   . Albuterol Sulfate HFA (PROAIR HFA) 108 (90 BASE) MCG/ACT Inhalation Aero Soln Inhale 2 puffs by mouth every 4 hours as needed. For Asthma. May use every 2 hours for attacks, or pereventively before exercise. 1 Inhaler 11   . FLUoxetine HCl 20 MG Oral Tab Take 1 tablet (20 mg) by mouth daily. 90 tablet 2     No facility-administered medications prior to visit.       Past Medical History   Diagnosis Date   . H/O lymph node biopsy      neck   . Asthma        Filed Vitals:    04/29/14 1333   BP: 128/84   Pulse: 60   Temp: 98.4 F (36.9 C)   TempSrc: Temporal   Resp: 16   Weight: 197 lb 6 oz (89.529 kg)   SpO2: 99%     -------------------------------------------------------------------------------    Review of Systems    Physical Exam   Constitutional: He appears well-developed and well-nourished.   HENT:   Head: Normocephalic and atraumatic.   Genitourinary: No phimosis, paraphimosis, hypospadias, penile erythema or penile tenderness. No discharge found.   Skin: Skin is warm and dry.   Psychiatric: He has a normal mood and affect. His behavior is normal. Judgment and thought content normal.         Assessment and Plan    1. Dysuria  - U/A AUTO DIPSTICK ONLY, ONSITE    2. Hematuria  No blood in the urine at this time. Most likely secondary to get out of rotation. Blood was at the beginning of his urine, there was no discoloration throughout the rest of the urine. No concerns about Sexually transmitted diseases, The patient declined lab work..  Discussed he should return if this continues.  - U/A AUTO DIPSTICK ONLY, ONSITE

## 2014-04-29 NOTE — Progress Notes (Signed)
Reason for visit: blood in urine    Have you seen a specialist since your last visit: NO          No specialty comments available.    Health Maintenance   Topic Date Due   . CHOLESTEROL- MALE  06/01/2018   . TETANUS BOOSTER  05/20/2022       Future Appointments  Date Time Provider Department Center   04/29/2014 1:30 PM Vivia BudgeCarey, [facility]

## 2014-04-29 NOTE — Patient Instructions (Signed)
1. Dysuria  - U/A AUTO DIPSTICK ONLY, ONSITE    2. Hematuria  No reason to be concerned as we discussed the blood was at the beginning of the urine, there is not blood in the urine and you are not having continued symptoms.   - U/A AUTO DIPSTICK ONLY, ONSITE

## 2014-05-03 ENCOUNTER — Encounter (INDEPENDENT_AMBULATORY_CARE_PROVIDER_SITE_OTHER): Payer: Self-pay | Admitting: Family Medicine

## 2015-01-10 ENCOUNTER — Ambulatory Visit (INDEPENDENT_AMBULATORY_CARE_PROVIDER_SITE_OTHER): Payer: BLUE CROSS/BLUE SHIELD

## 2015-01-10 ENCOUNTER — Ambulatory Visit (INDEPENDENT_AMBULATORY_CARE_PROVIDER_SITE_OTHER): Payer: BLUE CROSS/BLUE SHIELD | Admitting: Internal Medicine

## 2015-01-10 VITALS — BP 132/90 | HR 73 | Temp 97.8°F | Resp 17 | Ht 73.0 in | Wt 189.4 lb

## 2015-01-10 DIAGNOSIS — R112 Nausea with vomiting, unspecified: Secondary | ICD-10-CM

## 2015-01-10 DIAGNOSIS — R072 Precordial pain: Secondary | ICD-10-CM | POA: Diagnosis not present

## 2015-01-10 DIAGNOSIS — R071 Chest pain on breathing: Secondary | ICD-10-CM | POA: Diagnosis not present

## 2015-01-10 DIAGNOSIS — R1110 Vomiting, unspecified: Secondary | ICD-10-CM

## 2015-01-10 DIAGNOSIS — R1013 Epigastric pain: Secondary | ICD-10-CM | POA: Diagnosis not present

## 2015-01-10 LAB — POCT CBC
Granulocyte percent: 50.8 %G (ref 37–80)
HEMATOCRIT: 44.3 % (ref 43.5–53.7)
HEMOGLOBIN: 14.2 g/dL (ref 14.1–18.1)
LYMPH, POC: 2.5 (ref 0.6–3.4)
MCH, POC: 28.5 pg (ref 27–31.2)
MCHC: 32.1 g/dL (ref 31.8–35.4)
MCV: 88.8 fL (ref 80–97)
MID (cbc): 0.3 (ref 0–0.9)
MPV: 7.6 fL (ref 0–99.8)
PLATELET COUNT, POC: 236 10*3/uL (ref 142–424)
POC Granulocyte: 2.8 (ref 2–6.9)
POC LYMPH PERCENT: 44.6 %L (ref 10–50)
POC MID %: 4.6 % (ref 0–12)
RBC: 4.99 M/uL (ref 4.69–6.13)
RDW, POC: 15.4 %
WBC: 5.5 10*3/uL (ref 4.6–10.2)

## 2015-01-10 LAB — COMPREHENSIVE METABOLIC PANEL
ALT: 13 U/L (ref 0–53)
AST: 19 U/L (ref 0–37)
Albumin: 4.5 g/dL (ref 3.5–5.2)
Alkaline Phosphatase: 41 U/L (ref 39–117)
BILIRUBIN TOTAL: 0.5 mg/dL (ref 0.2–1.2)
BUN: 6 mg/dL (ref 6–23)
CO2: 24 mEq/L (ref 19–32)
CREATININE: 0.85 mg/dL (ref 0.50–1.35)
Calcium: 9.1 mg/dL (ref 8.4–10.5)
Chloride: 107 mEq/L (ref 96–112)
Glucose, Bld: 76 mg/dL (ref 70–99)
Potassium: 3.9 mEq/L (ref 3.5–5.3)
Sodium: 142 mEq/L (ref 135–145)
Total Protein: 7 g/dL (ref 6.0–8.3)

## 2015-01-10 LAB — AMYLASE: AMYLASE: 39 U/L (ref 0–105)

## 2015-01-10 MED ORDER — RANITIDINE HCL 150 MG PO CAPS: 150.0000 mg | ORAL_CAPSULE | Freq: Two times a day (BID) | ORAL | Status: DC

## 2015-01-10 MED ORDER — SUCRALFATE 1 GM/10ML PO SUSP: 1.0000 g | Freq: Three times a day (TID) | ORAL | Status: DC

## 2015-01-10 NOTE — Progress Notes (Addendum)
Subjective:  This chart was scribed for Ellamae Sia MD,  by Veverly Fells, at [facility].  This patient was seen in room 9 and the patient's care was started at 12:07 PM.    Patient ID: Robert Bruce, male    DOB: 1976-05-01, 39 y.o.   MRN: [id number] Chief Complaint  Patient presents with  . Abdominal Pain    sharp pain in the middle of stomach, hurts to laugh or cough     HPI  HPI Comments: Robert Bruce is a 39 y.o. male who presents to the [facility] complaining of sharp abdominal pain onset yesterday when he woke up.  He went on with his day yesterday and went to work last night but woke up this morning and is "hardly able to move". His symptoms worsen when taking a deep breath, laughing/coughing or changing position especially when he tries to sit up.  He has associated symptoms of vomitting  earlier today prior to arrival and doesn't think it was due to eating anything that may have upset his stomach.  His partner at home is currently sick and has a cough which he also has slightly and did not have before. He denies any recent illness, fever, sore throat, history of acid reflux, indigestion, fever or chills.   He is not currently on any medication.   His family does not have a history of heart disease.   Drinking: Per partner, she is concerned with his drinking (drinks every night) and he throws up every morning for what seems to be no apparent reason. Discomfort assoc w/ etoh at times.    Work: Patient works for [employer] (works on [employer]).     There are no active problems to display for this patient.  Past Medical History  Diagnosis Date  . Asthma    No past surgical history on file. Not on File Prior to Admission medications   Not on File   History   Social History  . Marital Status: Married    Spouse Name: N/A  . Number of Children: N/A  . Years of Education: N/A   Occupational History  . Not on file.   Social History  Main Topics  . Smoking status: Current Every Day Smoker -- 0.25 packs/day    Types: Cigarettes  . Smokeless tobacco: Not on file  . Alcohol Use: Not on file  . Drug Use: Not on file  . Sexual Activity: Not on file   Other Topics Concern  . Not on file   Social History Narrative  . No narrative on file     No current outpatient prescriptions on file prior to visit.   No current facility-administered medications on file prior to visit.   Not on File       Review of Systems  Constitutional: Negative for fever and chills.  Eyes: Negative for pain, discharge, redness and itching.  Respiratory: Positive for cough. Negative for choking.   Gastrointestinal: Positive for vomiting and abdominal pain. Negative for nausea, diarrhea and constipation.  Genitourinary: Negative for dysuria and urgency.       Objective:   Physical Exam  Constitutional: He is oriented to person, place, and time. He appears well-developed and well-nourished. No distress.  HENT:  Head: Normocephalic and atraumatic.  Right Ear: External ear normal.  Left Ear: External ear normal.  Nose: Nose normal.  Mouth/Throat: Oropharynx is clear and moist.  Eyes: Conjunctivae and EOM are normal. Pupils are  equal, round, and reactive to light.  Neck: Neck supple. No thyromegaly present.  Cardiovascular: Normal rate, regular rhythm and normal heart sounds.   No murmur heard. Pulmonary/Chest: Effort normal and breath sounds normal. No respiratory distress.  Abdominal: Bowel sounds are normal. There is no tenderness.  Mild epigastric guarding.   Musculoskeletal: Normal range of motion.  Lymphadenopathy:    He has no cervical adenopathy.  Neurological: He is alert and oriented to person, place, and time. No cranial nerve deficit.  Skin: Skin is warm and dry.  Psychiatric: He has a normal mood and affect. His behavior is normal.  Nursing note and vitals reviewed.  Filed Vitals:   01/10/15 1157  BP: 132/90    Pulse: 73  Temp: 97.8 F (36.6 C)  TempSrc: Oral  Resp: 17  Height: 6\' 1"  (1.854 m)  Weight: 189 lb 6.4 oz (85.911 kg)  SpO2: 98%   [facility] reading (PRIMARY) by  Dr. Merla Riches no active disease  Results for orders placed or performed in visit on 01/10/15  POCT CBC  Result Value Ref Range   WBC 5.5 4.6 - 10.2 K/uL   Lymph, poc 2.5 0.6 - 3.4   POC LYMPH PERCENT 44.6 10 - 50 %L   MID (cbc) 0.3 0 - 0.9   POC MID % 4.6 0 - 12 %M   POC Granulocyte 2.8 2 - 6.9   Granulocyte percent 50.8 37 - 80 %G   RBC 4.99 4.69 - 6.13 M/uL   Hemoglobin 14.2 14.1 - 18.1 g/dL   HCT, POC 11.9 41.7 - 53.7 %   MCV 88.8 80 - 97 fL   MCH, POC 28.5 27 - 31.2 pg   MCHC 32.1 31.8 - 35.4 g/dL   RDW, POC 40.8 %   Platelet Count, POC 236 142 - 424 K/uL   MPV 7.6 0 - 99.8 fL        Assessment & Plan:  I have completed the patient encounter in its entirety as documented by the scribe, with editing by me where necessary. Telsa Dillavou P. Merla Riches, M.D. 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 sucralfate (CARAFATE) 1 GM/10ML suspension    Sig: Take 10 mLs (1 g total) by mouth 4 (four) times daily -  with meals and at bedtime.    Dispense:  420 mL    Refill:  0  . ranitidine (ZANTAC) 150 MG capsule    Sig: Take 1 capsule (150 mg total) by mouth 2 (two) times daily.    Dispense:  60 capsule    Refill:  0   he will limit his intake Check amylase and liver function studies If not resolved in 2-4 weeks he will need endoscopy

## 2015-01-16 ENCOUNTER — Encounter: Payer: Self-pay | Admitting: Internal Medicine

## 2015-05-12 ENCOUNTER — Encounter (INDEPENDENT_AMBULATORY_CARE_PROVIDER_SITE_OTHER): Payer: Self-pay | Admitting: Family Medicine

## 2016-06-02 IMAGING — CR DG CHEST 2V
2 series · 2 of 2 positions shown · non-contrast
Comparison: None.

CLINICAL DATA: Chest pain.  Sharp abdominal pain.

EXAM:
CHEST  2 VIEW

[PA]
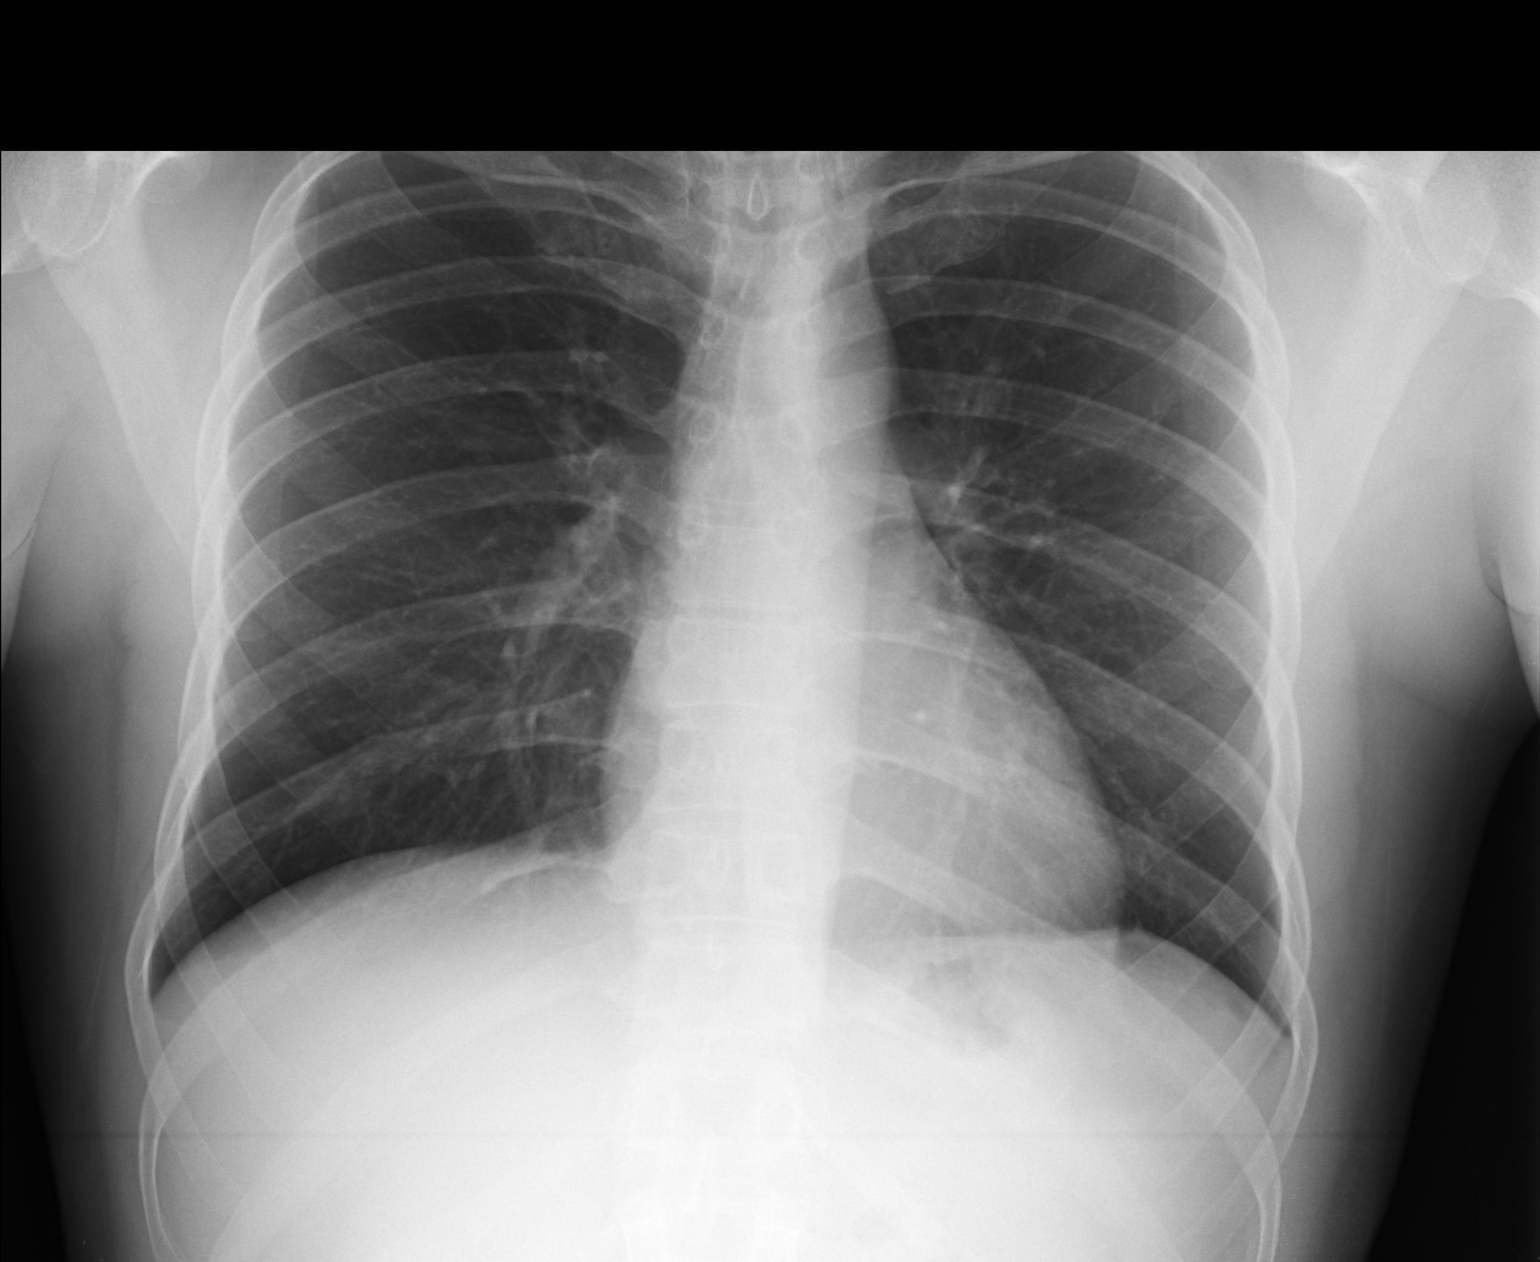

[lateral]
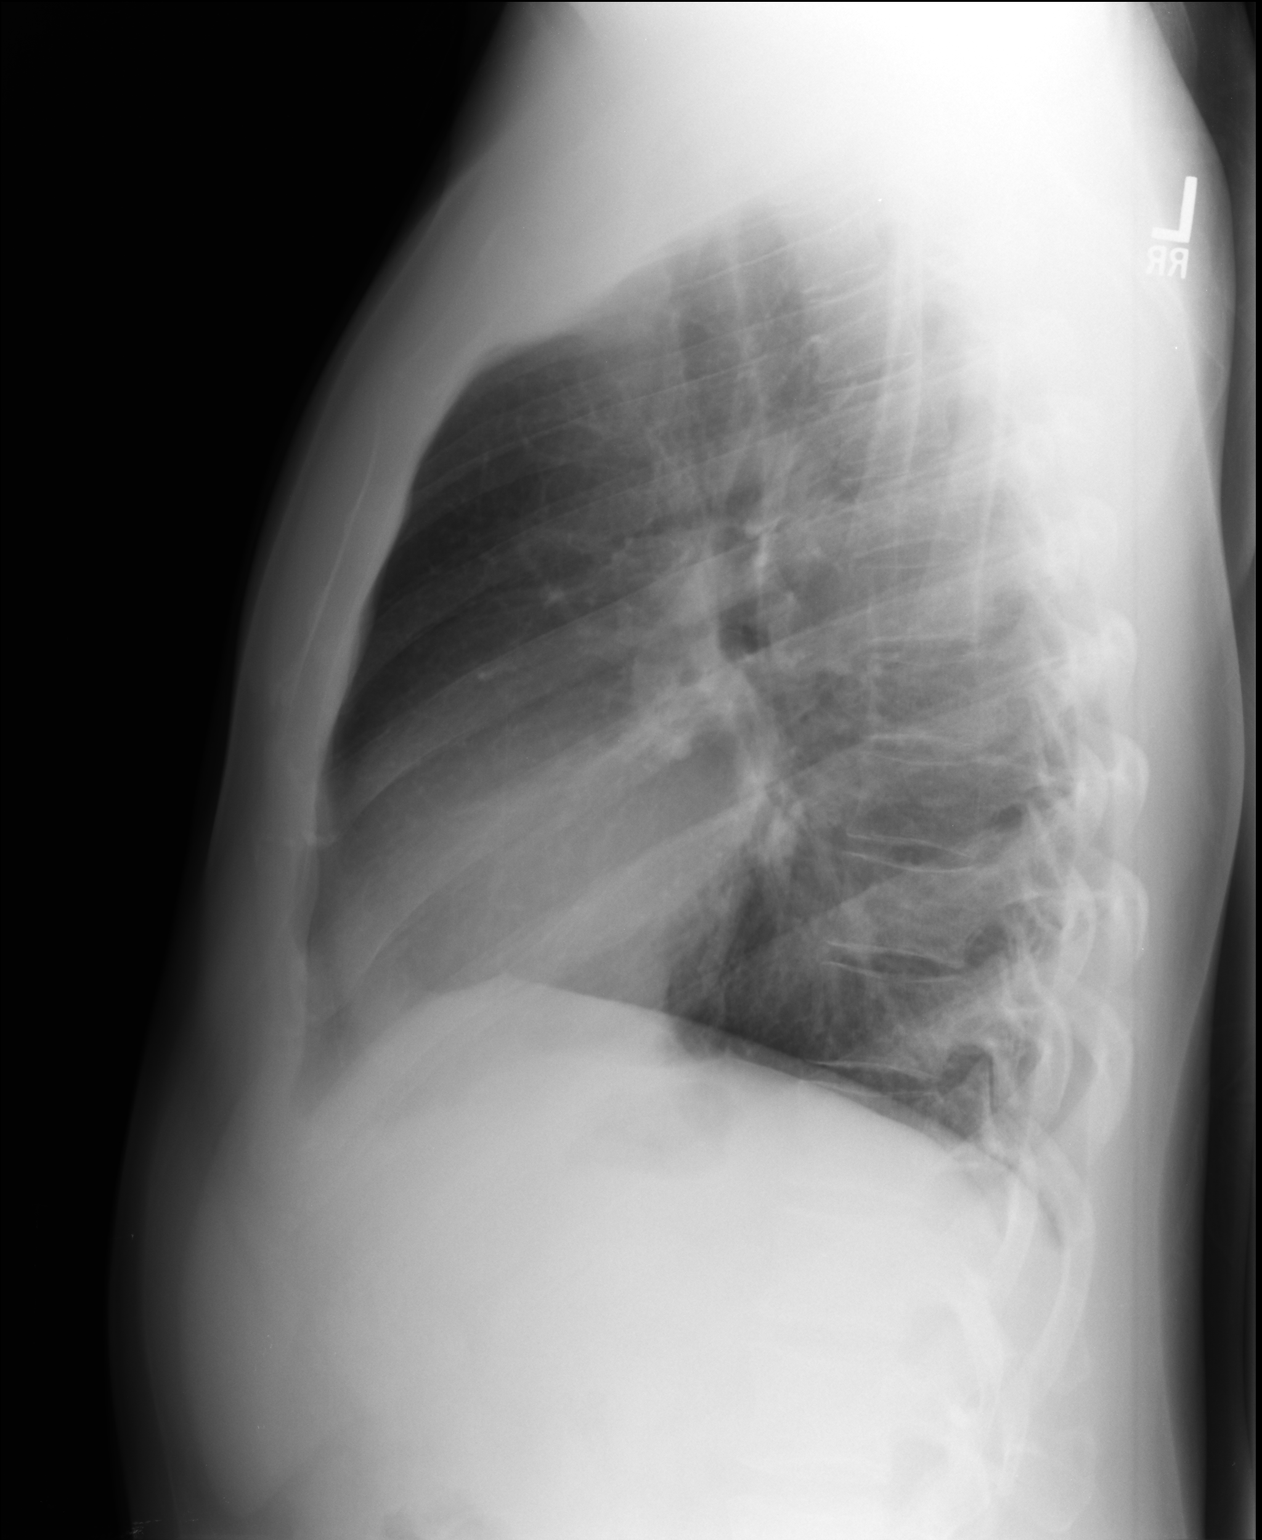

[2 of 2 positions shown; findings below may reference images not displayed]

FINDINGS: The heart size and mediastinal contours are within normal limits.
Both lungs are clear. The visualized skeletal structures are
unremarkable.
IMPRESSION: Normal chest.

## 2017-03-14 ENCOUNTER — Encounter: Payer: Self-pay | Admitting: Family Medicine

## 2017-04-15 ENCOUNTER — Emergency Department (HOSPITAL_COMMUNITY)
Admission: EM | Admit: 2017-04-15 | Discharge: 2017-04-15 | Disposition: A | Discharge status: Home / Self Care | Payer: Self-pay | Attending: Emergency Medicine | Admitting: Emergency Medicine

## 2017-04-15 ENCOUNTER — Emergency Department (HOSPITAL_COMMUNITY): Payer: Self-pay

## 2017-04-15 ENCOUNTER — Encounter (HOSPITAL_COMMUNITY): Payer: Self-pay

## 2017-04-15 DIAGNOSIS — K920 Hematemesis: Secondary | ICD-10-CM | POA: Insufficient documentation

## 2017-04-15 DIAGNOSIS — G44309 Post-traumatic headache, unspecified, not intractable: Secondary | ICD-10-CM | POA: Insufficient documentation

## 2017-04-15 DIAGNOSIS — Z87891 Personal history of nicotine dependence: Secondary | ICD-10-CM | POA: Insufficient documentation

## 2017-04-15 DIAGNOSIS — Z79899 Other long term (current) drug therapy: Secondary | ICD-10-CM | POA: Insufficient documentation

## 2017-04-15 LAB — CBC WITH DIFFERENTIAL/PLATELET
BASOS ABS: 0 10*3/uL (ref 0.0–0.1)
Basophils Relative: 1 %
Eosinophils Absolute: 0.2 10*3/uL (ref 0.0–0.7)
Eosinophils Relative: 4 %
HCT: 43.8 % (ref 39.0–52.0)
HEMOGLOBIN: 15.1 g/dL (ref 13.0–17.0)
LYMPHS ABS: 1.7 10*3/uL (ref 0.7–4.0)
Lymphocytes Relative: 42 %
MCH: 29.3 pg (ref 26.0–34.0)
MCHC: 34.5 g/dL (ref 30.0–36.0)
MCV: 85 fL (ref 78.0–100.0)
Monocytes Absolute: 0.3 10*3/uL (ref 0.1–1.0)
Monocytes Relative: 8 %
NEUTROS PCT: 45 %
Neutro Abs: 1.8 10*3/uL (ref 1.7–7.7)
PLATELETS: 240 10*3/uL (ref 150–400)
RBC: 5.15 MIL/uL (ref 4.22–5.81)
RDW: 15.1 % (ref 11.5–15.5)
WBC: 4 10*3/uL (ref 4.0–10.5)

## 2017-04-15 LAB — COMPREHENSIVE METABOLIC PANEL
ALK PHOS: 44 U/L (ref 38–126)
ALT: 20 U/L (ref 17–63)
AST: 21 U/L (ref 15–41)
Albumin: 4.8 g/dL (ref 3.5–5.0)
Anion gap: 10 (ref 5–15)
BUN: 10 mg/dL (ref 6–20)
CALCIUM: 9.3 mg/dL (ref 8.9–10.3)
CHLORIDE: 107 mmol/L (ref 101–111)
CO2: 24 mmol/L (ref 22–32)
CREATININE: 0.79 mg/dL (ref 0.61–1.24)
Glucose, Bld: 89 mg/dL (ref 65–99)
Potassium: 3.8 mmol/L (ref 3.5–5.1)
SODIUM: 141 mmol/L (ref 135–145)
Total Bilirubin: 0.8 mg/dL (ref 0.3–1.2)
Total Protein: 8.2 g/dL — ABNORMAL HIGH (ref 6.5–8.1)

## 2017-04-15 LAB — LIPASE, BLOOD: Lipase: 23 U/L (ref 11–51)

## 2017-04-15 NOTE — ED Triage Notes (Addendum)
Patient c/o intermittent frontal headache x 2 weeks. Patient also reports that he vomited x 3 with bright red blood in the first episode and the next two was lighter in color. Patient added that 2 weks ago he pulled a bay door on top of his head while at work.

## 2017-04-15 NOTE — ED Provider Notes (Signed)
[facility]-EMERGENCY DEPT Provider Note   CSN: [id number] Arrival date & time: 04/15/17  4098     History   Chief Complaint Chief Complaint  Patient presents with  . Hematemesis  . Headache    HPI Robert Bruce is a 41 y.o. male.  The history is provided by the patient.  Headache   This is a new problem. Episode onset: about 2-3 weeks. Episode frequency: intermittent. The problem has not changed since onset.Associated with: head trauma- reports getting hit in the head with a Bay Door at work. no LOC. no focal deficits. The pain is located in the frontal region. The quality of the pain is described as dull. The pain is moderate. The pain does not radiate. Associated symptoms include nausea and vomiting. Pertinent negatives include no anorexia, no fever, no malaise/fatigue, no chest pressure, no near-syncope, no orthopnea and no shortness of breath. Associated symptoms comments: N/V baseline for patient due to daily EtOH consumption. He has tried NSAIDs for the symptoms. The treatment provided moderate relief.   Also noted one episode of bright red hematemesis this AM that cleared up. No chest pain, SOB, fevers, chills, Abd pain.   Past Medical History:  Diagnosis Date  . Asthma     There are no active problems to display for this patient.   Past Surgical History:  Procedure Laterality Date  . lymph node removal         Home Medications    Prior to Admission medications   Medication Sig Start Date End Date Taking? Authorizing Provider  ibuprofen (ADVIL) 200 MG tablet Take 600 mg by mouth every 6 (six) hours as needed for moderate pain.   Yes [provider]  ranitidine (ZANTAC) 150 MG capsule Take 1 capsule (150 mg total) by mouth 2 (two) times daily. Patient not taking: Reported on 04/15/2017 01/10/15   Tonye Pearson, MD  sucralfate (CARAFATE) 1 GM/10ML suspension Take 10 mLs (1 g total) by mouth 4 (four) times daily -  with meals and at bedtime. Patient not  taking: Reported on 04/15/2017 01/10/15   Tonye Pearson, MD    Family History Family History  Problem Relation Age of Onset  . Hyperlipidemia Mother   . Hypertension Brother     Social History Social History  Substance Use Topics  . Smoking status: Former Smoker    Packs/day: 0.25    Types: Cigarettes  . Smokeless tobacco: Never Used  . Alcohol use 0.0 oz/week     Comment: daily use-1/2 bottle liquor     Allergies   Patient has no known allergies.   Review of Systems Review of Systems  Constitutional: Negative for fever and malaise/fatigue.  Respiratory: Negative for shortness of breath.   Cardiovascular: Negative for orthopnea and near-syncope.  Gastrointestinal: Positive for nausea and vomiting. Negative for anorexia.  Neurological: Positive for headaches.   All other systems are reviewed and are negative for acute change except as noted in the HPI   Physical Exam Updated Vital Signs BP (!) 149/96 (BP Location: Right Arm)   Pulse 85   Temp 98.1 F (36.7 C) (Oral)   Resp 17   Ht  (1.854 m)   Wt 90.7 kg (200 lb)   SpO2 99%   BMI 26.39 kg/m   Physical Exam  Constitutional: He is oriented to person, place, and time. He appears well-developed and well-nourished. No distress.  HENT:  Head: Normocephalic and atraumatic.  Nose: Nose normal.  Eyes: Pupils are equal, round,  and reactive to light. Conjunctivae and EOM are normal. Right eye exhibits no discharge. Left eye exhibits no discharge. No scleral icterus.  Neck: Normal range of motion. Neck supple.  Cardiovascular: Normal rate and regular rhythm.  Exam reveals no gallop and no friction rub.   No murmur heard. Pulmonary/Chest: Effort normal and breath sounds normal. No stridor. No respiratory distress. He has no rales.  Abdominal: Soft. He exhibits no distension. There is no hepatosplenomegaly. There is tenderness in the periumbilical area. There is no rigidity, no rebound and no guarding.    Musculoskeletal: He exhibits no edema or tenderness.  Neurological: He is alert and oriented to person, place, and time.  Mental Status: Alert and oriented to person, place, and time. Attention and concentration normal. Speech clear. Recent memory is intact  Cranial Nerves  II Visual Fields: Intact to confrontation. Visual fields intact. III, IV, VI: Pupils equal and reactive to light and near. Full eye movement without nystagmus  V Facial Sensation: Normal. No weakness of masticatory muscles  VII: No facial weakness or asymmetry  VIII Auditory Acuity: Grossly normal  IX/X: The uvula is midline; the palate elevates symmetrically  XI: Normal sternocleidomastoid and trapezius strength  XII: The tongue is midline. No atrophy or fasciculations.   Motor System: Muscle Strength: 5/5 and symmetric in the upper and lower extremities. No pronation or drift.  Muscle Tone: Tone and muscle bulk are normal in the upper and lower extremities.   Reflexes: DTRs: 1+ and symmetrical in all four extremities. Plantar responses are flexor bilaterally.  Coordination: Intact finger-to-nose, heel-to-shin. No tremor.  Sensation: Intact to light touch, and pinprick.  Gait: Routine gait normal.    Skin: Skin is warm and dry. No rash noted. He is not diaphoretic. No erythema.  Psychiatric: He has a normal mood and affect.  Vitals reviewed.    ED Treatments / Results  Labs (all labs ordered are listed, but only abnormal results are displayed) Labs Reviewed  COMPREHENSIVE METABOLIC PANEL - Abnormal; Notable for the following:       Result Value   Total Protein 8.2 (*)    All other components within normal limits  CBC WITH DIFFERENTIAL/PLATELET  LIPASE, BLOOD    EKG  EKG Interpretation None       Radiology Dg Chest 2 View  Result Date: 04/15/2017 CLINICAL DATA:  Hematemesis. EXAM: CHEST  2 VIEW COMPARISON:  Radiographs of January 10, 2015 FINDINGS: The heart size and mediastinal contours are within  normal limits. Both lungs are clear. The visualized skeletal structures are unremarkable. IMPRESSION: No active cardiopulmonary disease. Electronically Signed   By: Lupita Raider, M.D.   On: 04/15/2017 09:36    Procedures Procedures (including critical care time)  Medications Ordered in ED Medications - No data to display   Initial Impression / Assessment and Plan / ED Course  I have reviewed the triage vital signs and the nursing notes.  Pertinent labs & imaging results that were available during my care of the patient were reviewed by me and considered in my medical decision making (see chart for details).     Post- concussive syndrome. Doubt intracranial bleed given the intermittent nature of his pain.Non focal neuro exam. No fever. Doubt meningitis.  Doubt IIH. No indication for imaging.   Hematemesis likely secondary to Mallory-Weiss. Chest x-ray without pneumomediastinum.  Pt also endorsed daily EtOH consumption. No withdrawal symptoms. counseled for 5 min regarding risks of daily EtOH use. Recommended outpatient rehabilitation management. Patient was  unable to receiving the resources and appeared open to discontinuing use.  Given patient's abdominal discomfort on exam. Screening labs were grossly reassuring without evidence of pancreatitis, biliary obstruction. Low suspicion for serious intra-abdominal inflammatory/infectious process. No advanced imaging needed at this time.   The patient is safe for discharge with strict return precautions.    Final Clinical Impressions(s) / ED Diagnoses   Final diagnoses:  Hematemesis  Post-concussion headache   Disposition: Discharge  Condition: Good  I have discussed the results, Dx and Tx plan with the patient who expressed understanding and agree(s) with the plan. Discharge instructions discussed at great length. The patient was given strict return precautions who verbalized understanding of the instructions. No further  questions at time of discharge.    New Prescriptions   No medications on file    Follow Up: Cheryll Dessert [address] [place] [address] [phone]  Schedule an appointment as soon as possible for a visit  to follow-up for postconcussive syndrome      Cardama, Amadeo Garnet, MD 04/15/17 1011

## 2017-08-29 ENCOUNTER — Other Ambulatory Visit: Payer: Self-pay

## 2017-08-29 ENCOUNTER — Ambulatory Visit (INDEPENDENT_AMBULATORY_CARE_PROVIDER_SITE_OTHER): Payer: 59 | Admitting: Family Medicine

## 2017-08-29 ENCOUNTER — Encounter: Payer: Self-pay | Admitting: Family Medicine

## 2017-08-29 VITALS — HR 78 | Resp 16 | Ht 73.0 in | Wt 199.6 lb

## 2017-08-29 DIAGNOSIS — Z789 Other specified health status: Secondary | ICD-10-CM

## 2017-08-29 DIAGNOSIS — R110 Nausea: Secondary | ICD-10-CM | POA: Diagnosis not present

## 2017-08-29 DIAGNOSIS — R1011 Right upper quadrant pain: Secondary | ICD-10-CM

## 2017-08-29 DIAGNOSIS — F331 Major depressive disorder, recurrent, moderate: Secondary | ICD-10-CM | POA: Diagnosis not present

## 2017-08-29 DIAGNOSIS — F1090 Alcohol use, unspecified, uncomplicated: Secondary | ICD-10-CM

## 2017-08-29 LAB — POCT URINALYSIS DIP (MANUAL ENTRY)
BILIRUBIN UA: NEGATIVE mg/dL
Bilirubin, UA: NEGATIVE
Blood, UA: NEGATIVE
Glucose, UA: NEGATIVE mg/dL
LEUKOCYTES UA: NEGATIVE
NITRITE UA: NEGATIVE
PH UA: 6 (ref 5.0–8.0)
PROTEIN UA: NEGATIVE mg/dL
Spec Grav, UA: >=1.03 — AB (ref 1.010–1.025)
Urobilinogen, UA: 1 E.U./dL

## 2017-08-29 NOTE — Progress Notes (Signed)
Chief Complaint  Patient presents with  . stomach pains    onset: 08/24/17, no constipation but more frequency with bm's since belly pain started. Pain is in the mid abd area but can be all over at times. Pain level 7/10, no fever,  intermittent break outs on skin- abdominal area    HPI  Abdominal pain Pt reports that he has been having abdominal pain that started 08/24/17 He states that the pain is associated with increased frequency of BM He stools are softer but no diarrhea He denies blood per rectum He states that the pain seems to be upper abdomen Not directly aggravated by eating Denies fevers or chills + nausea but no vomiting He has his gallbladder  Heavy alcohol use He reports that he drinks 3-4 drinks a day  States that he was told his drinking is pretty heavy He drinks after work and drinks alone Denies early morning eye opener He feels like h he is developing a problem because he drinks so often  Depression Pt reports that before moving here he was on a medication for depression  He does not remember which one but felt like it was working He states that he has some suicidal thoughts but denies any active plan He is supported by his wife He states that he feels bad about himself Depression screen [facility] 2/9 08/29/2017 01/10/2015  Decreased Interest 2 0  Down, Depressed, Hopeless 1 0  PHQ - 2 Score 3 0  Altered sleeping 3 -  Tired, decreased energy 1 -  Change in appetite 2 -  Feeling bad or failure about yourself  2 -  Trouble concentrating 3 -  Moving slowly or fidgety/restless 2 -  Suicidal thoughts 1 -  PHQ-9 Score 17 -  Difficult doing work/chores Somewhat difficult -       Past Medical History:  Diagnosis Date  . Asthma     Current Outpatient Medications  Medication Sig Dispense Refill  . ibuprofen (ADVIL) 200 MG tablet Take 600 mg by mouth every 6 (six) hours as needed for moderate pain.    . ranitidine (ZANTAC) 150 MG capsule Take 1 capsule (150 mg  total) by mouth 2 (two) times daily. (Patient not taking: Reported on 04/15/2017) 60 capsule 0  . sucralfate (CARAFATE) 1 GM/10ML suspension Take 10 mLs (1 g total) by mouth 4 (four) times daily -  with meals and at bedtime. (Patient not taking: Reported on 04/15/2017) 420 mL 0   No current facility-administered medications for this visit.     Allergies: No Known Allergies  Past Surgical History:  Procedure Laterality Date  . lymph node removal      Social History   Socioeconomic History  . Marital status: Married    Spouse name: None  . Number of children: None  . Years of education: None  . Highest education level: None  Social Needs  . Financial resource strain: None  . Food insecurity - worry: None  . Food insecurity - inability: None  . Transportation needs - medical: None  . Transportation needs - non-medical: None  Occupational History  . None  Tobacco Use  . Smoking status: Former Smoker    Packs/day: 0.25    Types: Cigarettes  . Smokeless tobacco: Never Used  Substance and Sexual Activity  . Alcohol use: Yes    Alcohol/week: 0.0 oz    Comment: daily use-1/2 bottle liquor  . Drug use: No  . Sexual activity: None  Other Topics Concern  .  None  Social History Narrative  . None    Family History  Problem Relation Age of Onset  . Hyperlipidemia Mother   . Hypertension Brother      ROS Review of Systems See HPI Constitution: No fevers or chills No malaise No diaphoresis Skin: No rash or itching Eyes: no blurry vision, no double vision GU: no dysuria or hematuria Neuro: no dizziness or headaches all others reviewed and negative   Objective: Vitals:   08/29/17 1624  Pulse: 78  Resp: 16  SpO2: 99%  Weight: 199 lb 9.6 oz (90.5 kg)  Height: 6\' 1"  (1.854 m)    Physical Exam  Constitutional: He is oriented to person, place, and time. He appears well-developed and well-nourished.  HENT:  Head: Normocephalic and atraumatic.  Nose: Nose normal.    Mouth/Throat: Oropharynx is clear and moist. No oropharyngeal exudate.  Eyes: Conjunctivae and EOM are normal.  Cardiovascular: Normal rate, regular rhythm and normal heart sounds.  No murmur heard. Pulmonary/Chest: Effort normal and breath sounds normal. No stridor. No respiratory distress. He has no wheezes.  Abdominal: Soft. Bowel sounds are normal. He exhibits no distension. There is no hepatosplenomegaly. There is tenderness in the right upper quadrant and epigastric area. There is no rigidity, no rebound, no guarding, no CVA tenderness, no tenderness at McBurney's point and negative Murphy's sign. No hernia.    Neurological: He is alert and oriented to person, place, and time.  Skin: Skin is warm. Capillary refill takes less than 2 seconds. No erythema.  Psychiatric: He has a normal mood and affect. His behavior is normal. Judgment and thought content normal.    Assessment and Plan Colleen was seen today for stomach pains.  Diagnoses and all orders for this visit:  RUQ pain- will have pt get outpatient [place] and follow up He should monitor his symptoms for fevers or chills If symptoms worsen he should go to the ER -     Comprehensive metabolic panel -     CBC -     Lipase -     US Abdomen Limited RUQ; Future  Nausea- UTI ruled out -     POCT urinalysis dipstick  Moderate episode of recurrent major depressive disorder (HCC)- will check for underlying problems such as anemia and thyroid disease which can worsening depression symptoms -     CBC -     TSH  Heavy alcohol use- will check for gallstones and will check labs for lfts -     Comprehensive metabolic panel -     US Abdomen Limited RUQ; Future     Zoe A Creta Levin

## 2017-08-29 NOTE — Patient Instructions (Addendum)
   IF you received an x-ray today, you will receive an invoice from [facility]. Please contact [facility] at [phone] with questions or concerns regarding your invoice.   IF you received labwork today, you will receive an invoice from [facility]. Please contact [facility] at [phone] with questions or concerns regarding your invoice.   Our billing staff will not be able to assist you with questions regarding bills from these companies.  You will be contacted with the lab results as soon as they are available. The fastest way to get your results is to activate your My Chart account. Instructions are located on the last page of this paperwork. If you have not heard from us regarding the results in 2 weeks, please contact this office.     Living With Depression Everyone experiences occasional disappointment, sadness, and loss in their lives. When you are feeling down, blue, or sad for at least 2 weeks in a row, it may mean that you have depression. Depression can affect your thoughts and feelings, relationships, daily activities, and physical health. It is caused by changes in the way your brain functions. If you receive a diagnosis of depression, your health care provider will tell you which type of depression you have and what treatment options are available to you. If you are living with depression, there are ways to help you recover from it and also ways to prevent it from coming back. How to cope with lifestyle changes Coping with stress Stress is your body's reaction to life changes and events, both good and bad. Stressful situations may include:  Getting married.  The death of a spouse.  Losing a job.  Retiring.  Having a baby.  Stress can last just a few hours or it can be ongoing. Stress can play a major role in depression, so it is important to learn both how to cope with stress and how to think about it differently. Talk with your health care provider  or a counselor if you would like to learn more about stress reduction. He or she may suggest some stress reduction techniques, such as:  Music therapy. This can include creating music or listening to music. Choose music that you enjoy and that inspires you.  Mindfulness-based meditation. This kind of meditation can be done while sitting or walking. It involves being aware of your normal breaths, rather than trying to control your breathing.  Centering prayer. This is a kind of meditation that involves focusing on a spiritual word or phrase. Choose a word, phrase, or sacred image that is meaningful to you and that brings you peace.  Deep breathing. To do this, expand your stomach and inhale slowly through your nose. Hold your breath for 3-5 seconds, then exhale slowly, allowing your stomach muscles to relax.  Muscle relaxation. This involves intentionally tensing muscles then relaxing them.  Choose a stress reduction technique that fits your lifestyle and personality. Stress reduction techniques take time and practice to develop. Set aside 5-15 minutes a day to do them. Therapists can offer training in these techniques. The training may be covered by some insurance plans. Other things you can do to manage stress include:  Keeping a stress diary. This can help you learn what triggers your stress and ways to control your response.  Understanding what your limits are and saying no to requests or events that lead to a schedule that is too full.  Thinking about how you respond to certain situations. You may not be   able to control everything, but you can control how you react.  Adding humor to your life by watching funny films or TV shows.  Making time for activities that help you relax and not feeling guilty about spending your time this way.  Medicines Your health care provider may suggest certain medicines if he or she feels that they will help improve your condition. Avoid using alcohol and  other substances that may prevent your medicines from working properly (may interact). It is also important to:  Talk with your pharmacist or health care provider about all the medicines that you take, their possible side effects, and what medicines are safe to take together.  Make it your goal to take part in all treatment decisions (shared decision-making). This includes giving input on the side effects of medicines. It is best if shared decision-making with your health care provider is part of your total treatment plan.  If your health care provider prescribes a medicine, you may not notice the full benefits of it for 4-8 weeks. Most people who are treated for depression need to be on medicine for at least 6-12 months after they feel better. If you are taking medicines as part of your treatment, do not stop taking medicines without first talking to your health care provider. You may need to have the medicine slowly decreased (tapered) over time to decrease the risk of harmful side effects. Relationships Your health care provider may suggest family therapy along with individual therapy and drug therapy. While there may not be family problems that are causing you to feel depressed, it is still important to make sure your family learns as much as they can about your mental health. Having your family's support can help make your treatment successful. How to recognize changes in your condition Everyone has a different response to treatment for depression. Recovery from major depression happens when you have not had signs of major depression for two months. This may mean that you will start to:  Have more interest in doing activities.  Feel less hopeless than you did 2 months ago.  Have more energy.  Overeat less often, or have better or improving appetite.  Have better concentration.  Your health care provider will work with you to decide the next steps in your recovery. It is also important to  recognize when your condition is getting worse. Watch for these signs:  Having fatigue or low energy.  Eating too much or too little.  Sleeping too much or too little.  Feeling restless, agitated, or hopeless.  Having trouble concentrating or making decisions.  Having unexplained physical complaints.  Feeling irritable, angry, or aggressive.  Get help as soon as you or your family members notice these symptoms coming back. How to get support and help from others How to talk with friends and family members about your condition Talking to friends and family members about your condition can provide you with one way to get support and guidance. Reach out to trusted friends or family members, explain your symptoms to them, and let them know that you are working with a health care provider to treat your depression. Financial resources Not all insurance plans cover mental health care, so it is important to check with your insurance carrier. If paying for co-pays or counseling services is a problem, search for a local or county mental health care center. They may be able to offer public mental health care services at low or no cost when you are not   able to see a private health care provider. If you are taking medicine for depression, you may be able to get the generic form, which may be less expensive. Some makers of prescription medicines also offer help to patients who cannot afford the medicines they need. Follow these instructions at home:  Get the right amount and quality of sleep.  Cut down on using caffeine, tobacco, alcohol, and other potentially harmful substances.  Try to exercise, such as walking or lifting small weights.  Take over-the-counter and prescription medicines only as told by your health care provider.  Eat a healthy diet that includes plenty of vegetables, fruits, whole grains, low-fat dairy products, and lean protein. Do not eat a lot of foods that are high in solid  fats, added sugars, or salt.  Keep all follow-up visits as told by your health care provider. This is important. Contact a health care provider if:  You stop taking your antidepressant medicines, and you have any of these symptoms: ? Nausea. ? Headache. ? Feeling lightheaded. ? Chills and body aches. ? Not being able to sleep (insomnia).  You or your friends and family think your depression is getting worse. Get help right away if:  You have thoughts of hurting yourself or others. If you ever feel like you may hurt yourself or others, or have thoughts about taking your own life, get help right away. You can go to your nearest emergency department or call:  Your local emergency services (911 in the [place]  A suicide crisis helpline, such as the National Suicide Prevention Lifeline at 1-800-273-8255. This is open 24-hours a day.  Summary  If you are living with depression, there are ways to help you recover from it and also ways to prevent it from coming back.  Work with your health care team to create a management plan that includes counseling, stress management techniques, and healthy lifestyle habits. This information is not intended to replace advice given to you by your health care provider. Make sure you discuss any questions you have with your health care provider. Document Released: 06/11/2016 Document Revised: 06/11/2016 Document Reviewed: 06/11/2016 Elsevier Interactive Patient Education  2018 Elsevier Inc.  

## 2017-08-30 LAB — COMPREHENSIVE METABOLIC PANEL
ALBUMIN: 5.3 g/dL (ref 3.5–5.5)
ALT: 23 IU/L (ref 0–44)
AST: 22 IU/L (ref 0–40)
Albumin/Globulin Ratio: 2 (ref 1.2–2.2)
Alkaline Phosphatase: 47 IU/L (ref 39–117)
BUN / CREAT RATIO: 13 (ref 9–20)
BUN: 12 mg/dL (ref 6–24)
Bilirubin Total: 0.3 mg/dL (ref 0.0–1.2)
CALCIUM: 9.8 mg/dL (ref 8.7–10.2)
CO2: 19 mmol/L — AB (ref 20–29)
CREATININE: 0.94 mg/dL (ref 0.76–1.27)
Chloride: 100 mmol/L (ref 96–106)
GFR, EST AFRICAN AMERICAN: 117 mL/min/{1.73_m2} (ref >59)
GFR, EST NON AFRICAN AMERICAN: 101 mL/min/{1.73_m2} (ref >59)
GLUCOSE: 76 mg/dL (ref 65–99)
Globulin, Total: 2.7 g/dL (ref 1.5–4.5)
Potassium: 4.6 mmol/L (ref 3.5–5.2)
Sodium: 138 mmol/L (ref 134–144)
TOTAL PROTEIN: 8 g/dL (ref 6.0–8.5)

## 2017-08-30 LAB — CBC
HEMOGLOBIN: 14.2 g/dL (ref 13.0–17.7)
Hematocrit: 41.4 % (ref 37.5–51.0)
MCH: 29.1 pg (ref 26.6–33.0)
MCHC: 34.3 g/dL (ref 31.5–35.7)
MCV: 85 fL (ref 79–97)
Platelets: 273 10*3/uL (ref 150–379)
RBC: 4.88 x10E6/uL (ref 4.14–5.80)
RDW: 15 % (ref 12.3–15.4)
WBC: 6.2 10*3/uL (ref 3.4–10.8)

## 2017-08-30 LAB — LIPASE: LIPASE: 24 U/L (ref 13–78)

## 2017-08-30 LAB — TSH: TSH: 4.7 u[IU]/mL — ABNORMAL HIGH (ref 0.450–4.500)

## 2017-09-05 ENCOUNTER — Other Ambulatory Visit: Payer: Self-pay

## 2017-09-05 ENCOUNTER — Ambulatory Visit (INDEPENDENT_AMBULATORY_CARE_PROVIDER_SITE_OTHER): Payer: 59 | Admitting: Family Medicine

## 2017-09-05 VITALS — BP 130/88 | HR 68 | Temp 99.3°F | Resp 16 | Ht 71.25 in | Wt 201.0 lb

## 2017-09-05 DIAGNOSIS — F321 Major depressive disorder, single episode, moderate: Secondary | ICD-10-CM | POA: Diagnosis not present

## 2017-09-05 DIAGNOSIS — F1090 Alcohol use, unspecified, uncomplicated: Secondary | ICD-10-CM

## 2017-09-05 DIAGNOSIS — Z789 Other specified health status: Secondary | ICD-10-CM

## 2017-09-05 MED ORDER — CITALOPRAM HYDROBROMIDE 10 MG PO TABS: 10.0000 mg | ORAL_TABLET | Freq: Every day | ORAL | 1 refills | Status: DC

## 2017-09-05 NOTE — Patient Instructions (Addendum)
1. Start the celexa at 10mg  then increase to 20mg  (two tablets) after one week.  2. Return to clinic in one month 3. Follow up with counseling    IF you received an x-ray today, you will receive an invoice from [facility]. Please contact [facility] at [phone] with questions or concerns regarding your invoice.   IF you received labwork today, you will receive an invoice from [facility]. Please contact [facility] at [phone] with questions or concerns regarding your invoice.   Our billing staff will not be able to assist you with questions regarding bills from these companies.  You will be contacted with the lab results as soon as they are available. The fastest way to get your results is to activate your My Chart account. Instructions are located on the last page of this paperwork. If you have not heard from [place] regarding the results in 2 weeks, please contact this office.     Alcohol Use Disorder Alcohol use disorder is when your drinking disrupts your daily life. When you have this condition, you drink too much alcohol and you cannot control your drinking. Alcohol use disorder can cause serious problems with your physical health. It can affect your brain, heart, liver, pancreas, immune system, stomach, and intestines. Alcohol use disorder can increase your risk for certain cancers and cause problems with your mental health, such as depression, anxiety, psychosis, delirium, and dementia. People with this disorder risk hurting themselves and others. What are the causes? This condition is caused by drinking too much alcohol over time. It is not caused by drinking too much alcohol only one or two times. Some people with this condition drink alcohol to cope with or escape from negative life events. Others drink to relieve pain or symptoms of mental illness. What increases the risk? You are more likely to develop this condition if:  You have a family history of alcohol  use disorder.  Your culture encourages drinking to the point of intoxication, or makes alcohol easy to get.  You had a mood or conduct disorder in childhood.  You have been a victim of abuse.  You are an adolescent and: ? You have poor grades or difficulties in school. ? Your caregivers do not talk to you about saying no to alcohol, or supervise your activities. ? You are impulsive or you have trouble with self-control.  What are the signs or symptoms? Symptoms of this condition include:  Drinkingmore than you want to.  Drinking for longer than you want to.  Trying several times to drink less or to control your drinking.  Spending a lot of time getting alcohol, drinking, or recovering from drinking.  Craving alcohol.  Having problems at work, at school, or at home due to drinking.  Having problems in relationships due to drinking.  Drinking when it is dangerous to drink, such as before driving a car.  Continuing to drink even though you know you might have a physical or mental problem related to drinking.  Needing more and more alcohol to get the same effect you want from the alcohol (building up tolerance).  Having symptoms of withdrawal when you stop drinking. Symptoms of withdrawal include: ? Fatigue. ? Nightmares. ? Trouble sleeping. ? Depression. ? Anxiety. ? Fever. ? Seizures. ? Severe confusion. ? Feeling or seeing things that are not there (hallucinations). ? Tremors. ? Rapid heart rate. ? Rapid breathing. ? High blood pressure.  Drinking to avoid symptoms of withdrawal.  How is this diagnosed? This  condition is diagnosed with an assessment. Your health care provider may start the assessment by asking three or four questions about your drinking. Your health care provider may perform a physical exam or do lab tests to see if you have physical problems resulting from alcohol use. She or he may refer you to a mental health professional for evaluation. How  is this treated? Some people with alcohol use disorder are able to reduce their alcohol use to low-risk levels. Others need to completely quit drinking alcohol. When necessary, mental health professionals with specialized training in substance use treatment can help. Your health care provider can help you decide how severe your alcohol use disorder is and what type of treatment you need. The following forms of treatment are available:  Detoxification. Detoxification involves quitting drinking and using prescription medicines within the first week to help lessen withdrawal symptoms. This treatment is important for people who have had withdrawal symptoms before and for heavy drinkers who are likely to have withdrawal symptoms. Alcohol withdrawal can be dangerous, and in severe cases, it can cause death. Detoxification may be provided in a home, community, or primary care setting, or in a hospital or substance use treatment facility.  Counseling. This treatment is also called talk therapy. It is provided by substance use treatment counselors. A counselor can address the reasons you use alcohol and suggest ways to keep you from drinking again or to prevent problem drinking. The goals of talk therapy are to: ? Find healthy activities and ways for you to cope with stress. ? Identify and avoid the things that trigger your alcohol use. ? Help you learn how to handle cravings.  Medicines.Medicines can help treat alcohol use disorder by: ? Decreasing alcohol cravings. ? Decreasing the positive feeling you have when you drink alcohol. ? Causing an uncomfortable physical reaction when you drink alcohol (aversion therapy).  Support groups. Support groups are led by people who have quit drinking. They provide emotional support, advice, and guidance.  These forms of treatment are often combined. Some people with this condition benefit from a combination of treatments provided by specialized substance use  treatment centers. Follow these instructions at home:  Take over-the-counter and prescription medicines only as told by your health care provider.  Check with your health care provider before starting any new medicines.  Ask friends and family members not to offer you alcohol.  Avoid situations where alcohol is served, including gatherings where others are drinking alcohol.  Create a plan for what to do when you are tempted to use alcohol.  Find hobbies or activities that you enjoy that do not include alcohol.  Keep all follow-up visits as told by your health care provider. This is important. How is this prevented?  If you drink, limit alcohol intake to no more than 1 drink a day for nonpregnant women and 2 drinks a day for men. One drink equals 12 oz of beer, 5 oz of wine, or 1 oz of hard liquor.  If you have a mental health condition, get treatment and support.  Do not give alcohol to adolescents.  If you are an adolescent: ? Do not drink alcohol. ? Do not be afraid to say no if someone offers you alcohol. Speak up about why you do not want to drink. You can be a positive role model for your friends and set a good example for those around you by not drinking alcohol. ? If your friends drink, spend time with others who do  not drink alcohol. Make new friends who do not use alcohol. ? Find healthy ways to manage stress and emotions, such as meditation or deep breathing, exercise, spending time in nature, listening to music, or talking with a trusted friend or family member. Contact a health care provider if:  You are not able to take your medicines as told.  Your symptoms get worse.  You return to drinking alcohol (relapse) and your symptoms get worse. Get help right away if:  You have thoughts about hurting yourself or others. If you ever feel like you may hurt yourself or others, or have thoughts about taking your own life, get help right away. You can go to your nearest  emergency department or call:  Your local emergency services (911 in the [place]  A suicide crisis helpline, such as the National Suicide Prevention Lifeline at 669-368-89591-253 100 6948. This is open 24 hours a day.  Summary  Alcohol use disorder is when your drinking disrupts your daily life. When you have this condition, you drink too much alcohol and you cannot control your drinking.  Treatment may include detoxification, counseling, medicine, and support groups.  Ask friends and family members not to offer you alcohol. Avoid situations where alcohol is served.  Get help right away if you have thoughts about hurting yourself or others. This information is not intended to replace advice given to you by your health care provider. Make sure you discuss any questions you have with your health care provider. Document Released: 08/16/2004 Document Revised: 04/05/2016 Document Reviewed: 04/05/2016 Elsevier Interactive Patient Education  [employer]2018 Elsevier Inc.

## 2017-09-05 NOTE — Progress Notes (Signed)
Chief Complaint  Patient presents with  . Abdominal Pain    FOLLOW UP- RUQ  . 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 Asthma     Current Outpatient Medications  Medication Sig Dispense Refill  . citalopram (CELEXA) 10 MG tablet Take 1 tablet (10 mg total) by mouth daily. 60 tablet 1  . ibuprofen (ADVIL) 200 MG tablet Take 600 mg by mouth every 6 (six) hours as needed for moderate pain.    . ranitidine (ZANTAC) 150 MG capsule Take 1 capsule (150 mg total) by mouth 2 (two) times daily. (Patient not taking: Reported on 04/15/2017) 60 capsule 0  . sucralfate (CARAFATE) 1 GM/10ML suspension Take 10 mLs (1 g total) by mouth 4 (four) times daily -  with meals and at bedtime. (Patient not taking: Reported on 04/15/2017) 420 mL 0   No current facility-administered medications for this visit.     Allergies: No Known Allergies  Past Surgical History:  Procedure Laterality Date  . lymph node removal      Social History   Socioeconomic History  . Marital status: Married   Spouse name: Not on file  . Number of children: Not on file  . Years of education: Not on file  . Highest education level: Not on file  Social Needs  . Financial resource strain: Not on file  . Food insecurity - worry: Not on file  . Food insecurity - inability: Not on file  . Transportation needs - medical: Not on file  . Transportation needs - non-medical: Not on file  Occupational History  . Not on file  Tobacco Use  . Smoking status: Former Smoker    Packs/day: 0.25    Types: Cigarettes  . Smokeless tobacco: Never Used  Substance and Sexual Activity  . Alcohol use: Yes    Alcohol/week: 0.0 oz    Comment: daily use-1/2 bottle liquor  . Drug use: No  . Sexual activity: Not on file  Other Topics Concern  . Not on file  Social History Narrative  . Not on file    Family History  Problem Relation Age of Onset  . Hyperlipidemia Mother   . Hypertension Brother      ROS Review of Systems See HPI Constitution: No fevers or chills No malaise No diaphoresis Skin:  No rash or itching Eyes: no blurry vision, no double vision GU: no dysuria or hematuria Neuro: no dizziness or headaches all others reviewed and negative   Objective: Vitals:   09/05/17 1602  BP: 130/88  Pulse: 68  Resp: 16  Temp: 99.3 F (37.4 C)  TempSrc: Oral  SpO2: 98%  Weight: 201 lb (91.2 kg)  Height: 5' 11.25" (1.81 m)    Physical Exam Physical Exam  Constitutional: She is oriented to person, place, and time. She appears well-developed and well-nourished.  HENT:  Head: Normocephalic and atraumatic.  Eyes: Conjunctivae and EOM are normal.  Cardiovascular: Normal rate, regular rhythm and normal heart sounds.   Pulmonary/Chest: Effort normal and breath sounds normal. No respiratory distress. She has no wheezes.  Abdominal: Normal appearance and bowel sounds are normal. There is no tenderness. There is no CVA tenderness.  Neurological: She is alert and oriented to person, place, and time.     Assessment and Plan Robert Bruce was seen today for abdominal pain and depression.  Diagnoses and all orders for this visit:  Moderate major depression (HCC)- discussed ssri and SNRIs Will start Celexa and titrate up to treat underlying mood disorder that can worsen his alcohol use -     citalopram (CELEXA) 10 MG tablet; Take 1 tablet (10 mg total) by mouth daily.  Heavy alcohol use- patient and his wife were receptive to counseling programs gave information for centers in the area Reviewed outpatient detox program and support groups like AA Also discussed risks of stopping alcohol abruptly Reviewed labs [place] is pending  A total of 45 minutes were spent face-to-face with the patient during this encounter and over half of that time was spent on counseling and coordination of care.    Randalyn Ahmed A Yamina Lenis

## 2017-09-17 ENCOUNTER — Telehealth: Payer: Self-pay | Admitting: Family Medicine

## 2017-09-17 DIAGNOSIS — F321 Major depressive disorder, single episode, moderate: Secondary | ICD-10-CM

## 2017-09-17 NOTE — Telephone Encounter (Signed)
Copied from CRM [phone]. Topic: Quick Communication - Rx Refill/Question >> Sep 17, 2017 11:53 AM Landry MellowFoltz, Melissa J wrote: Medication: citalopram (CELEXA) 10 MG tablet   Has the patient contacted their pharmacy? Yes.     (Agent: If no, request that the patient contact the pharmacy for the refill.)   Preferred Pharmacy (with phone number or street name): [facility]  - [phone]    Agent: Please be advised that RX refills may take up to 3 business days. We ask that you follow-up with your pharmacy.

## 2017-09-18 ENCOUNTER — Ambulatory Visit
Admission: RE | Admit: 2017-09-18 | Discharge: 2017-09-18 | Disposition: A | Discharge status: Home / Self Care | Payer: Self-pay | Source: Ambulatory Visit | Attending: Family Medicine | Admitting: Family Medicine

## 2017-09-18 DIAGNOSIS — F1090 Alcohol use, unspecified, uncomplicated: Secondary | ICD-10-CM

## 2017-09-18 DIAGNOSIS — R1011 Right upper quadrant pain: Secondary | ICD-10-CM

## 2017-09-18 DIAGNOSIS — K838 Other specified diseases of biliary tract: Secondary | ICD-10-CM | POA: Diagnosis not present

## 2017-09-18 DIAGNOSIS — Z789 Other specified health status: Secondary | ICD-10-CM

## 2017-09-18 NOTE — Telephone Encounter (Signed)
Attempted to contact pt regarding prescription refill request; last office visit 09/05/17 and prescription written for 60 tablets with 1 refill; unable to clarify what the pt needs; unable to leave message at [phone]; pre-recorded message says voice mail is not set up.

## 2017-09-19 MED ORDER — CITALOPRAM HYDROBROMIDE 10 MG PO TABS: 10.0000 mg | ORAL_TABLET | Freq: Every day | ORAL | 1 refills | Status: DC

## 2017-09-19 NOTE — Addendum Note (Signed)
Addended by: Amado CoeARMSTRONG, Tacarra Justo N on: 09/19/2017 11:25 AM   Modules accepted: Orders

## 2017-09-19 NOTE — Telephone Encounter (Signed)
Pt states he was told that in order for insurance to cover his medication he would need to get the medication through a mail order pharmacy. [facility] [address]. Notified pt that Celexa would be sent to [facility] as requested.

## 2017-10-03 ENCOUNTER — Encounter: Payer: Self-pay | Admitting: Family Medicine

## 2017-10-03 ENCOUNTER — Other Ambulatory Visit: Payer: Self-pay

## 2017-10-03 ENCOUNTER — Ambulatory Visit (INDEPENDENT_AMBULATORY_CARE_PROVIDER_SITE_OTHER): Payer: 59 | Admitting: Family Medicine

## 2017-10-03 VITALS — BP 132/88 | HR 75 | Resp 16 | Ht 71.46 in | Wt 196.8 lb

## 2017-10-03 DIAGNOSIS — F321 Major depressive disorder, single episode, moderate: Secondary | ICD-10-CM

## 2017-10-03 DIAGNOSIS — R1011 Right upper quadrant pain: Secondary | ICD-10-CM

## 2017-10-03 MED ORDER — CITALOPRAM HYDROBROMIDE 20 MG PO TABS: 20.0000 mg | ORAL_TABLET | Freq: Every day | ORAL | 1 refills | Status: DC

## 2017-10-03 MED ORDER — CITALOPRAM HYDROBROMIDE 10 MG PO TABS: 20.0000 mg | ORAL_TABLET | Freq: Every day | ORAL | 1 refills | Status: DC

## 2017-10-03 NOTE — Patient Instructions (Addendum)
IF you received an x-ray today, you will receive an invoice from [facility]. Please contact [facility] at [phone] with questions or concerns regarding your invoice.   IF you received labwork today, you will receive an invoice from [facility]. Please contact [facility] at [phone] with questions or concerns regarding your invoice.   Our billing staff will not be able to assist you with questions regarding bills from these companies.  You will be contacted with the lab results as soon as they are available. The fastest way to get your results is to activate your My Chart account. Instructions are located on the last page of this paperwork. If you have not heard from us regarding the results in 2 weeks, please contact this office.      Supporting Someone With Depression Depression is a mental health condition that affects the way a person feels, thinks, and handles daily activities such as eating, sleeping, and working. When a person has depression, his or her condition can affect others around him or her, such as friends and family members. Friends and family can help by offering support and understanding. What do I need to know about this condition? The main symptoms of depression are:  Constant depressed or irritable mood.  Loss of interest in things and activities that were enjoyed in the past.  Other symptoms of depression include:  Fatigue.  Sleeping too much or too little.  Difficulty falling asleep, or waking up early and not being able to get back to sleep.  Difficulty concentrating or making decisions.  Changes in appetite and weight.  Staying away from others (isolating oneself).  Expressing feelings of guilt.  Expressing suicidal thoughts or feelings.  What do I need to know about the treatment options? This condition is usually treated by mental health providers such as psychologists, psychiatrists, and clinical social workers.  Treatment may include one or more of the following:  Psychotherapy, also called talk therapy or counseling. Types of psychotherapy include individual or group therapy, and they usually involve the following approaches: ? Cognitive behavioral therapy (CBT). This type of therapy teaches a person how to recognize feelings, thoughts, and behaviors that contribute to depression. The person is taught to make a choice about how to respond to these feelings, thoughts, and behaviors so that he or she can experience fewer symptoms. ? Interpersonal therapy (IPT). This helps to improve the way that someone with depression relates to and communicates with others. This type of therapy may involve caregivers and loved ones. ? Family therapy. This treatment helps family members to communicate and deal with conflict in healthy ways.  Medicine. This is often used to help with certain emotions and behaviors.  Combining medicine and therapy is often the most effective approach. The following lifestyle changes may also help with managing symptoms of depression:  Limiting alcohol and drug use.  Exercising regularly.  Getting enough good-quality sleep.  Making healthy eating choices.  Reducing distressing situations.  Spending time outside.  Following regular daily routines.  How can I support my loved one? Talk about the condition Good communication is the key to supporting your friend or family member. Here are a few things to keep in mind:  Ask your loved one how you can support him or her.  Respect your loved one's right to make decisions.  Be careful about too much prodding. Try not to overdo reminders to an adult friend or family member about things like taking medicines. Ask how your  loved one prefers that you help.  Be encouraging and offer emotional support. This can help to lower stress. Even saying something simple to comfort your loved one may help.  Never ignore comments about suicide, and  do not try to avoid the subject of suicide. Talking about suicide will not make your loved one want to act on it. You or your loved one can reach out 24 hours a day to get free, private support (on the phone or a live online chat) from a suicide crisis helpline, such as the National Suicide Prevention Lifeline at (323)596-0009.  Listening is very important. Be available if your friend or family member wants to talk. Make an effort to acknowledge his or her feelings and stay calm and realistic.  Find support and resources A health care provider may be able to recommend mental health resources that are available online or over the phone. You could start with:  Government sites such as the Substance Abuse and Mental Health Services Administration (SAMHSA): SkateOasis.com.pt  National mental health organizations such as the [employer] on Mental [employer]ness (NAMI): www.nami.org  You may also consider:  Joining self-help and support groups, not only for your friend or family member, but also for yourself. People in these peer and family support groups understand what you and your loved one are going through. They can help you feel a sense of hope and connect you with local resources to help you learn more.  Attending family therapy with your loved one.  General support  Make an effort to learn all you can about depression.  Help your loved one follow his or her treatment plan as directed by health care providers. This could mean driving him or her to therapy sessions or suggesting ways to cope with stress.  Ask your loved one if you may join him or her for a therapy session or go with him or her to health care visits. Joining your loved one with his or her permission can give you an opportunity to learn how to be more supportive.  Include your loved one in activities. Invite her or him to go for walks and outings. At first, your loved one may not want to, but keep trying.  Be patient and do not  expect your loved one to do too much too soon.  Help with daily responsibilities, such as laundry or meals. Sometimes daily tasks seem overwhelming to a person with depression.  Remember that your support really matters. Social support is a huge benefit for someone who is coping with depression. How can I create a safe environment? If your loved one feels unable to control his or her behavior, it may be necessary to take steps to keep his or her home safe. Such steps may include:  Locking up alcohol and prescription pills that your loved one may turn to. Count prescription pills often. You may want to consider removing alcohol from the home.  Removing or locking up guns and other weapons. If you do not have a safe place to keep a gun, local law enforcement may store a gun for you.  Making a written crisis plan. Include important phone numbers, such as the local crisis intervention team. Make sure that: ? The person with depression knows about this plan. ? Everyone who has regular contact with that person knows about the plan and knows what to do in an emergency.  How should I care for myself? \[email or web address]

## 2017-10-03 NOTE — Progress Notes (Signed)
Chief Complaint  Patient presents with  . Moderate major depression (HCC)    Follow up, feeling better.     HPI   Since cutting back on his alcohol consumption the RUQ pain has resolved There is not further pain and cramping He was taking some reflux medication but stopped No nausea, vomiting or diarrhea  Depression  Pt reports that he is feeling much better overall  He reports that he is now taking Citalopram in the evening  He states that he ws very sleepy when he took it in the mornings He has cut back on his alcohol consumption and now his RUQ pain has resolved.  He reports that his mood is better  [facility] 10/03/2017 09/05/2017 08/29/2017  Decreased Interest 1 1 2   Down, Depressed, Hopeless 1 2 1   PHQ - 2 Score 2 3 3   Altered sleeping 1 2 3   Tired, decreased energy 1 1 1   Change in appetite 1 0 2  Feeling bad or failure about yourself  0 1 2  Trouble concentrating 1 2 3   Moving slowly or fidgety/restless 1 1 2   Suicidal thoughts 0 0 1  PHQ-9 Score 7 10 17   Difficult doing work/chores Somewhat difficult - Somewhat difficult    Past Medical History:  Diagnosis Date  . Asthma     Current Outpatient Medications  Medication Sig Dispense Refill  . citalopram (CELEXA) 20 MG tablet Take 1 tablet (20 mg total) by mouth daily. 90 tablet 1   No current facility-administered medications for this visit.     Allergies: No Known Allergies  Past Surgical History:  Procedure Laterality Date  . lymph node removal      Social History   Socioeconomic History  . Marital status: Married    Spouse name: Not on file  . Number of children: Not on file  . Years of education: Not on file  . Highest education level: Not on file  Occupational History  . Not on file  Social Needs  . Financial resource strain: Not on file  . Food insecurity:    Worry: Not on file    Inability: Not on file  . Transportation needs:    Medical: Not on file    Non-medical: Not  on file  Tobacco Use  . Smoking status: Former Smoker    Packs/day: 0.25    Types: Cigarettes  . Smokeless tobacco: Never Used  Substance and Sexual Activity  . Alcohol use: Yes    Alcohol/week: 0.0 oz    Comment: daily use-1/2 bottle liquor  . Drug use: No  . Sexual activity: Not on file  Lifestyle  . Physical activity:    Days per week: Not on file    Minutes per session: Not on file  . Stress: Not on file  Relationships  . Social connections:    Talks on phone: Not on file    Gets together: Not on file    Attends religious service: Not on file    Active member of club or organization: Not on file    Attends meetings of clubs or organizations: Not on file    Relationship status: Not on file  Other Topics Concern  . Not on file  Social History Narrative  . Not on file    Family History  Problem Relation Age of Onset  . Hyperlipidemia Mother   . Hypertension Brother      ROS Review of Systems See HPI Constitution: No fevers or  chills No malaise No diaphoresis Skin: No rash or itching Eyes: no blurry vision, no double vision GU: no dysuria or hematuria Neuro: no dizziness or headaches all others reviewed and negative   Objective: Vitals:   10/03/17 1545  BP: 132/88  Pulse: 75  Resp: 16  Weight: 196 lb 12.8 oz (89.3 kg)  Height: 5' 11.46" (1.815 m)   Wt Readings from Last 3 Encounters:  10/03/17 196 lb 12.8 oz (89.3 kg)  09/05/17 201 lb (91.2 kg)  08/29/17 199 lb 9.6 oz (90.5 kg)    Physical Exam  Constitutional: He is oriented to person, place, and time. He appears well-developed and well-nourished.  HENT:  Head: Normocephalic and atraumatic.  Cardiovascular: Normal rate, regular rhythm and normal heart sounds.  No murmur heard. Pulmonary/Chest: Effort normal and breath sounds normal. No stridor. No respiratory distress.  Neurological: He is alert and oriented to person, place, and time.     CLINICAL DATA:  Right upper quadrant  pain.  EXAM: ULTRASOUND ABDOMEN LIMITED RIGHT UPPER QUADRANT  COMPARISON:  None.  FINDINGS: Gallbladder:  Trace sludge. No gallstones or wall thickening visualized. No sonographic Murphy sign noted by sonographer.  Common bile duct:  Diameter: 3 mm, normal.  Liver:  No focal lesion identified. Within normal limits in parenchymal echogenicity. Portal vein is patent on color Doppler imaging with normal direction of blood flow towards the liver.  IMPRESSION: 1. Trace gallbladder sludge.  No acute abnormality.   Electronically Signed   By: Obie Dredge M.D.   On: 09/18/2017 12:37    Assessment and Plan Bird was seen today for moderate major depression (hcc).  Diagnoses and all orders for this visit:  RUQ pain -   Pain resolved.  Might have been due to alcohol causing irritation Reviewed RUQ with patient   Moderate major depression (HCC) -     Discontinue: citalopram (CELEXA) 10 MG tablet; Take 2 tablets (20 mg total) by mouth daily. -     citalopram (CELEXA) 20 MG tablet; Take 1 tablet (20 mg total) by mouth daily. Increased dose to 20mg   He should finish up his 10mg  tablets by taking 2 at a time Follow up in 3 months If he is very stable and improving may discuss tapering off but for now he is showing good progress  A total of 25 minutes were spent face-to-face with the patient during this encounter and over half of that time was spent on counseling and coordination of care.   Antoin Dargis A Letishia Elliott

## 2018-01-02 ENCOUNTER — Telehealth: Payer: Self-pay | Admitting: Family Medicine

## 2018-01-02 NOTE — Telephone Encounter (Signed)
MyChart message sent to pt about rescheduling apt on 6/20 with [facility] °

## 2018-01-09 ENCOUNTER — Ambulatory Visit: Payer: 59 | Admitting: Family Medicine

## 2018-05-01 ENCOUNTER — Telehealth: Payer: Self-pay | Admitting: Family Medicine

## 2018-05-01 ENCOUNTER — Other Ambulatory Visit: Payer: Self-pay | Admitting: Family Medicine

## 2018-05-01 DIAGNOSIS — F321 Major depressive disorder, single episode, moderate: Secondary | ICD-10-CM

## 2018-05-01 NOTE — Telephone Encounter (Signed)
30 day courtesy refill given.   Appt made for 05/30/18 with St[facility].  Requested Prescriptions  Pending Prescriptions Disp Refills  . citalopram (CELEXA) 20 MG tablet [Pharmacy Med Name: CITALOPRAM  20MG   TAB] 30 tablet 0    Sig: TAKE 1 TABLET BY MOUTH  DAILY     Psychiatry:  Antidepressants - SSRI Failed - 05/01/2018  5:27 AM      Failed - Valid encounter within last 6 months    Recent Outpatient Visits          7 months ago Moderate major depression (HCC)   Primary Care at [facility], Zoe A, MD   7 months ago Moderate major depression [facility])   Primary Care at [facility], Manus Rudd, MD   8 months ago RUQ pain   Primary Care at [facility], Manus Rudd, MD   3 years ago Precordial pain   Primary Care at [facility], Harrel Lemon, MD      Future Appointments            In 4 weeks Doristine Bosworth, MD Primary Care at [place] [facility], [facility]

## 2018-05-01 NOTE — Telephone Encounter (Signed)
I called pt and made him aware he was due his 6 month f/u appt to continue refilling his medication.    I let him know I gave him a 30 day supply of his Celexa to last until his appt.  I scheduled him with Dr. Creta Levin for 05/30/18 for 10:40.

## 2018-05-30 ENCOUNTER — Encounter: Payer: Self-pay | Admitting: Family Medicine

## 2018-05-30 ENCOUNTER — Ambulatory Visit (INDEPENDENT_AMBULATORY_CARE_PROVIDER_SITE_OTHER): Payer: 59 | Admitting: Family Medicine

## 2018-05-30 VITALS — BP 126/84 | HR 76 | Temp 98.4°F | Resp 18 | Ht 71.0 in | Wt 189.4 lb

## 2018-05-30 DIAGNOSIS — R1013 Epigastric pain: Secondary | ICD-10-CM

## 2018-05-30 DIAGNOSIS — F4321 Adjustment disorder with depressed mood: Secondary | ICD-10-CM

## 2018-05-30 DIAGNOSIS — R1033 Periumbilical pain: Secondary | ICD-10-CM

## 2018-05-30 DIAGNOSIS — Z789 Other specified health status: Secondary | ICD-10-CM

## 2018-05-30 DIAGNOSIS — R808 Other proteinuria: Secondary | ICD-10-CM | POA: Diagnosis not present

## 2018-05-30 DIAGNOSIS — F1090 Alcohol use, unspecified, uncomplicated: Secondary | ICD-10-CM

## 2018-05-30 DIAGNOSIS — F321 Major depressive disorder, single episode, moderate: Secondary | ICD-10-CM | POA: Diagnosis not present

## 2018-05-30 LAB — POCT CBC
GRANULOCYTE PERCENT: 53.1 % (ref 37–80)
HCT, POC: 42.8 % — AB (ref 29–41)
Hemoglobin: 14.7 g/dL — AB (ref 9.5–13.5)
Lymph, poc: 2.2 (ref 0.6–3.4)
MCH: 30.2 pg (ref 27–31.2)
MCHC: 34.3 g/dL (ref 31.8–35.4)
MCV: 88 fL (ref 76–111)
MID (cbc): 0.5 (ref 0–0.9)
MPV: 7.4 fL (ref 0–99.8)
PLATELET COUNT, POC: 266 10*3/uL (ref 142–424)
POC Granulocyte: 3 (ref 2–6.9)
POC LYMPH %: 39 % (ref 10–50)
POC MID %: 7.9 %M (ref 0–12)
RBC: 4.86 M/uL (ref 4.69–6.13)
RDW, POC: 15.8 %
WBC: 5.7 10*3/uL (ref 4.6–10.2)

## 2018-05-30 LAB — POCT URINALYSIS DIP (MANUAL ENTRY)
Bilirubin, UA: NEGATIVE
Glucose, UA: NEGATIVE mg/dL
Ketones, POC UA: NEGATIVE mg/dL
Leukocytes, UA: NEGATIVE
Nitrite, UA: NEGATIVE
RBC UA: NEGATIVE
SPEC GRAV UA: 1.015 (ref 1.010–1.025)
UROBILINOGEN UA: 1 U/dL
pH, UA: 8 (ref 5.0–8.0)

## 2018-05-30 MED ORDER — CITALOPRAM HYDROBROMIDE 20 MG PO TABS: 20.0000 mg | ORAL_TABLET | Freq: Every day | ORAL | 3 refills | Status: DC

## 2018-05-30 MED ORDER — CITALOPRAM HYDROBROMIDE 20 MG PO TABS: 20.0000 mg | ORAL_TABLET | Freq: Every day | ORAL | 0 refills | Status: DC

## 2018-05-30 MED ORDER — PANTOPRAZOLE SODIUM 40 MG PO TBEC: 40.0000 mg | DELAYED_RELEASE_TABLET | Freq: Every day | ORAL | 0 refills | Status: DC

## 2018-05-30 NOTE — Patient Instructions (Addendum)
If you have lab work done today you will be contacted with your lab results within the next 2 weeks.  If you have not heard from [place] then please contact us. The fastest way to get your results is to register for My Chart.   IF you received an x-ray today, you will receive an invoice from [facility]. Please contact [facility] at [phone] with questions or concerns regarding your invoice.   IF you received labwork today, you will receive an invoice from [place] [facility]. Please contact [facility] at [phone] with questions or concerns regarding your invoice.   Our billing staff will not be able to assist you with questions regarding bills from these companies.  You will be contacted with the lab results as soon as they are available. The fastest way to get your results is to activate your My Chart account. Instructions are located on the last page of this paperwork. If you have not heard from [place] regarding the results in 2 weeks, please contact this office.      Esophagogastroduodenoscopy Esophagogastroduodenoscopy (EGD) is a procedure to examine the lining of the esophagus, stomach, and first part of the small intestine (duodenum). This procedure is done to check for problems such as inflammation, bleeding, ulcers, or growths. During this procedure, a long, flexible, lighted tube with a camera attached (endoscope) is inserted down the throat. Tell a health care provider about:  Any allergies you have.  All medicines you are taking, including vitamins, herbs, eye drops, creams, and over-the-counter medicines.  Any problems you or family members have had with anesthetic medicines.  Any blood disorders you have.  Any surgeries you have had.  Any medical conditions you have.  Whether you are pregnant or may be pregnant. What are the risks? Generally, this is a safe procedure. However, problems may occur, including:  Infection.  Bleeding.  A tear (perforation)  in the esophagus, stomach, or duodenum.  Trouble breathing.  Excessive sweating.  Spasms of the larynx.  A slowed heartbeat.  Low blood pressure.  What happens before the procedure?  Follow instructions from your health care provider about eating or drinking restrictions.  Ask your health care provider about: ? Changing or stopping your regular medicines. This is especially important if you are taking diabetes medicines or blood thinners. ? Taking medicines such as aspirin and ibuprofen. These medicines can thin your blood. Do not take these medicines before your procedure if your health care provider instructs you not to.  Plan to have someone take you home after the procedure.  If you wear dentures, be ready to remove them before the procedure. What happens during the procedure?  To reduce your risk of infection, your health care team will wash or sanitize their hands.  An IV tube will be put in a vein in your hand or arm. You will get medicines and fluids through this tube.  You will be given one or more of the following: ? A medicine to help you relax (sedative). ? A medicine to numb the area (local anesthetic). This medicine may be sprayed into your throat. It will make you feel more comfortable and keep you from gagging or coughing during the procedure. ? A medicine for pain.  A mouth guard may be placed in your mouth to protect your teeth and to keep you from biting on the endoscope.  You will be asked to lie on your left side.  The endoscope will be lowered down your throat  into your esophagus, stomach, and duodenum.  Air will be put into the endoscope. This will help your health care provider see better.  The lining of your esophagus, stomach, and duodenum will be examined.  Your health care provider may: ? Take a tissue sample so it can be looked at in a lab (biopsy). ? Remove growths. ? Remove objects (foreign bodies) that are stuck. ? Treat any bleeding  with medicines or other devices that stop tissue from bleeding. ? Widen (dilate) or stretch narrowed areas of your esophagus and stomach.  The endoscope will be taken out. The procedure may vary among health care providers and hospitals. What happens after the procedure?  Your blood pressure, heart rate, breathing rate, and blood oxygen level will be monitored often until the medicines you were given have worn off.  Do not eat or drink anything until the numbing medicine has worn off and your gag reflex has returned. This information is not intended to replace advice given to you by your health care provider. Make sure you discuss any questions you have with your health care provider. Document Released: 11/09/2004 Document Revised: 12/15/2015 Document Reviewed: 06/02/2015 Elsevier Interactive Patient Education  [employer].

## 2018-05-30 NOTE — Progress Notes (Signed)
Chief Complaint  Patient presents with  . depression    6 month follow up on his depression; takes medication every day with no issue  . PH Q score    15  . Abdominal Pain    states he has had some abd pain for the past few days    HPI  Patient reports that he had some alcohol Tuesday night then on Wednesday he developed Pain in the lower abdomen that caused him to vomit He states that it was like a band of pain around his belly button The first pain was real sharp then he sat down and after vomiting the pain got dull He reports that he seems to get the pain after drinking He did not try any reflux medications He states that he is skipping meals due to his pain  October 15 - his best friend and his wife's brother passed away from a motor cycle accident He reports that he had a tragic event happen in the family so he has been feeling pretty up and down He tries to talk about it but talking about it is also tough He denies suicidal ideation When the tragedy first happened he slept 2-3 days straight  Now his sleep is interrupted during the night He was off work for a week   Depression screen [facility] 2/9 05/30/2018 10/03/2017 09/05/2017 08/29/2017 01/10/2015  Decreased Interest _0 0  Down, Depressed, Hopeless _1 0  PHQ - 2 Score _2 0  Altered sleeping _3 -  Tired, decreased energy _4 -  Change in appetite 2 1 0 2 -  Feeling bad or failure about yourself  1 0 1 2 -  Trouble concentrating _5 -  Moving slowly or fidgety/restless _6 -  Suicidal thoughts 0 0 0 1 -  PHQ-9 Score _7 -  Difficult doing work/chores Very difficult Somewhat difficult - Somewhat difficult -     Past Medical History:  Diagnosis Date  . Asthma     Current Outpatient Medications  Medication Sig Dispense Refill  . citalopram (CELEXA) 20 MG tablet Take 1 tablet (20 mg total) by mouth daily. 90 tablet 3  . pantoprazole (PROTONIX) 40 MG tablet Take 1 tablet (40 mg total)  by mouth daily. 90 tablet 0   No current facility-administered medications for this visit.     Allergies: No Known Allergies  Past Surgical History:  Procedure Laterality Date  . lymph node removal      Social History   Socioeconomic History  . Marital status: Married    Spouse name: Not on file  . Number of children: Not on file  . Years of education: Not on file  . Highest education level: Not on file  Occupational History  . Not on file  Social Needs  . Financial resource strain: Not on file  . Food insecurity:    Worry: Not on file    Inability: Not on file  . Transportation needs:    Medical: Not on file    Non-medical: Not on file  Tobacco Use  . Smoking status: Former Smoker    Packs/day: 0.25    Types: Cigarettes  . Smokeless tobacco: Never Used  Substance and Sexual Activity  . Alcohol use: Yes    Alcohol/week: 0.0 standard drinks    Comment: daily use-1/2 bottle liquor  . Drug use: No  .  Sexual activity: Not on file  Lifestyle  . Physical activity:    Days per week: Not on file    Minutes per session: Not on file  . Stress: Not on file  Relationships  . Social connections:    Talks on phone: Not on file    Gets together: Not on file    Attends religious service: Not on file    Active member of club or organization: Not on file    Attends meetings of clubs or organizations: Not on file    Relationship status: Not on file  Other Topics Concern  . Not on file  Social History Narrative  . Not on file    Family History  Problem Relation Age of Onset  . Hyperlipidemia Mother   . Hypertension Brother      ROS Review of Systems See HPI Constitution: No fevers or chills No malaise No diaphoresis Skin: No rash or itching Eyes: no blurry vision, no double vision GU: no dysuria or hematuria Neuro: no dizziness or headaches all others reviewed and negative   Objective: Vitals:   05/30/18 1113  BP: 126/84  Pulse: 76  Resp: 18  Temp:  98.4 F (36.9 C)  TempSrc: Oral  SpO2: 100%  Weight: 189 lb 6.4 oz (85.9 kg)  Height: _0  (1.803 m)    Physical Exam Physical Exam  Constitutional: She is oriented to person, place, and time. She appears well-developed and well-nourished.  HENT:  Head: Normocephalic and atraumatic.  Eyes: Conjunctivae and EOM are normal.  Cardiovascular: Normal rate, regular rhythm and normal heart sounds.   Pulmonary/Chest: Effort normal and breath sounds normal. No respiratory distress. She has no wheezes.  Abdominal: Normal appearance and bowel sounds are normal. There is no tenderness. There is no CVA tenderness. +PERIUMBILICAL TENDERNESS [facility] NEG   Neurological: She is alert and oriented to person, place, and time.    Assessment and Plan Raheel was seen today for depression, ph q score and abdominal pain.  Diagnoses and all orders for this visit:  Periumbilical abdominal pain- no leukocytosis Will refer for EGD Will check lipase and liver enzymes Advised pt to start PPI and decrease alcohol intake -     Cancel: HIV Antibody (routine testing w rflx) -     Lipase -     POCT CBC -     POCT urinalysis dipstick -     CMP14+EGFR -     Ambulatory referral to Gastroenterology  Moderate major depression ([place])- discussed resuming Celexa -     citalopram (CELEXA) 20 MG tablet; Take 1 tablet (20 mg total) by mouth daily.  Grieving- offered resources for grief counseling, pt supported by family  Heavy alcohol use- discussed alcohol and the worsening gastritis -     Ambulatory referral to Gastroenterology  Recurrent epigastric abdominal pain- referral for EGD Pt to start protonix while he is waiting for consultation Advised smoking cessation Discussed reducing alcohol intake -     Ambulatory referral to Gastroenterology -     pantoprazole (PROTONIX) 40 MG tablet; Take 1 tablet (40 mg total) by mouth daily.     [place]

## 2018-05-31 LAB — CMP14+EGFR
ALK PHOS: 52 IU/L (ref 39–117)
ALT: 22 IU/L (ref 0–44)
AST: 17 IU/L (ref 0–40)
Albumin/Globulin Ratio: 1.6 (ref 1.2–2.2)
Albumin: 4.9 g/dL (ref 3.5–5.5)
BILIRUBIN TOTAL: 0.4 mg/dL (ref 0.0–1.2)
BUN / CREAT RATIO: 10 (ref 9–20)
BUN: 8 mg/dL (ref 6–24)
CHLORIDE: 104 mmol/L (ref 96–106)
CO2: 20 mmol/L (ref 20–29)
Calcium: 10 mg/dL (ref 8.7–10.2)
Creatinine, Ser: 0.83 mg/dL (ref 0.76–1.27)
GFR calc Af Amer: 126 mL/min/{1.73_m2} (ref >59)
GFR calc non Af Amer: 109 mL/min/{1.73_m2} (ref >59)
GLUCOSE: 95 mg/dL (ref 65–99)
Globulin, Total: 3.1 g/dL (ref 1.5–4.5)
Potassium: 4.4 mmol/L (ref 3.5–5.2)
Sodium: 140 mmol/L (ref 134–144)
Total Protein: 8 g/dL (ref 6.0–8.5)

## 2018-05-31 LAB — HEMOGLOBIN A1C
Est. average glucose Bld gHb Est-mCnc: 114 mg/dL
HEMOGLOBIN A1C: 5.6 % (ref 4.8–5.6)

## 2018-05-31 LAB — LIPASE: Lipase: 21 U/L (ref 13–78)

## 2018-05-31 LAB — MICROALBUMIN, URINE: Microalbumin, Urine: 16.7 ug/mL

## 2018-06-05 ENCOUNTER — Encounter: Payer: Self-pay | Admitting: Gastroenterology

## 2018-06-16 ENCOUNTER — Ambulatory Visit (INDEPENDENT_AMBULATORY_CARE_PROVIDER_SITE_OTHER): Payer: 59 | Admitting: Gastroenterology

## 2018-06-16 ENCOUNTER — Encounter: Payer: Self-pay | Admitting: Gastroenterology

## 2018-06-16 VITALS — BP 132/88 | HR 73 | Ht 72.0 in | Wt 194.4 lb

## 2018-06-16 DIAGNOSIS — K219 Gastro-esophageal reflux disease without esophagitis: Secondary | ICD-10-CM

## 2018-06-16 DIAGNOSIS — R1013 Epigastric pain: Secondary | ICD-10-CM | POA: Diagnosis not present

## 2018-06-16 MED ORDER — PANTOPRAZOLE SODIUM 40 MG PO TBEC: 40.0000 mg | DELAYED_RELEASE_TABLET | Freq: Every day | ORAL | 6 refills | Status: DC

## 2018-06-16 NOTE — Progress Notes (Signed)
Chief Complaint: Abdominal pain  Referring Provider:  Doristine BosworthStallings, Zoe A, MD      ASSESSMENT AND PLAN;   #1.  Epigastric/periumbilical pain- neg US 08/2017, normal CBC, CMP and lipase 05/2018.  Resolved on Protonix. #2.  GERD  Plan: -Would continue Protonix 40 mg p.o. once a day for now. -Avoid nonsteroidals. -Cutdown on alcohol and try to stop if possible. -Since he feels better, he would like to hold off on EGD.  He will call us if he starts having any recurrent problems.  At that time, would proceed with EGD. -Recommend colorectal cancer screening at the age of 41 as per current recommendations.  If there is any change in recommendations, then would recommend at the age of 42. -I have discussed in detail with the patient and patient's wife.   HPI:    Robert Bruce is a 42 y.o. male  With history of epigastric/periumbilical pain associated with nausea and occasional heartburn. Seen by Dr. Sherron AlesStallings Started on Protonix 40 mg p.o. once a day Patient felt better after 5 days and did not have any further problems. Currently, No nausea, vomiting, heartburn, regurgitation, odynophagia or dysphagia.  No significant diarrhea or constipation.  There is no melena or hematochezia. No unintentional weight loss.   Past Medical History:  Diagnosis Date  . Asthma     Past Surgical History:  Procedure Laterality Date  . lymph node removal      Family History  Problem Relation Age of Onset  . Hyperlipidemia Mother   . Hypertension Brother   . Kidney disease Maternal Grandmother   . Colon cancer Neg Hx   . Esophageal cancer Neg Hx     Social History   Tobacco Use  . Smoking status: Former Smoker    Packs/day: 0.25    Types: Cigarettes  . Smokeless tobacco: Never Used  Substance Use Topics  . Alcohol use: Yes    Alcohol/week: 0.0 standard drinks    Comment: daily use-1/2 bottle liquor  . Drug use: No    Current Outpatient Medications  Medication Sig Dispense Refill  .  citalopram (CELEXA) 20 MG tablet Take 1 tablet (20 mg total) by mouth daily. 30 tablet 0  . pantoprazole (PROTONIX) 40 MG tablet Take 1 tablet (40 mg total) by mouth daily. 90 tablet 0   No current facility-administered medications for this visit.     No Known Allergies  Review of Systems:  Constitutional: Denies fever, chills, diaphoresis, appetite change and fatigue.  HEENT: Denies photophobia, eye pain, redness, hearing loss, ear pain, congestion, sore throat, rhinorrhea, sneezing, mouth sores, neck pain, neck stiffness and tinnitus.   Respiratory: Denies SOB, DOE, cough, chest tightness,  and wheezing.   Cardiovascular: Denies chest pain, palpitations and leg swelling.  Genitourinary: Denies dysuria, urgency, frequency, hematuria, flank pain and difficulty urinating.  Musculoskeletal: Denies myalgias, back pain, joint swelling, arthralgias and gait problem.  Skin: No rash.  Neurological: Denies dizziness, seizures, syncope, weakness, light-headedness, numbness and headaches.  Hematological: Denies adenopathy. Easy bruising, personal or family bleeding history  Psychiatric/Behavioral: Has anxiety or depression     Physical Exam:    BP 132/88   Pulse 73   Ht 6' (1.829 m)   Wt 194 lb 6 oz (88.2 kg)   BMI 26.36 kg/m  Filed Weights   06/16/18 1357  Weight: 194 lb 6 oz (88.2 kg)   Constitutional:  Well-developed, in no acute distress. Psychiatric: Normal mood and affect. Behavior is normal. HEENT: Pupils normal.  Conjunctivae are normal. No scleral icterus. Cardiovascular: Normal rate, regular rhythm. No edema Pulmonary/chest: Effort normal and breath sounds normal. No wheezing, rales or rhonchi. Abdominal: Soft, nondistended. Nontender. Bowel sounds active throughout. There are no masses palpable. No hepatomegaly. Rectal:  defered Neurological: Alert and oriented to person place and time. Skin: Skin is warm and dry. No rashes noted.  Data Reviewed: I have personally  reviewed following labs and imaging studies  CBC: CBC Latest Ref Rng & Units 05/30/2018 08/29/2017 04/15/2017  WBC 4.6 - 10.2 K/uL 5.7 6.2 4.0  Hemoglobin 9.5 - 13.5 g/dL 14.7(A) 14.2 15.1  Hematocrit 29 - 41 % 42.8(A) 41.4 43.8  Platelets 150 - 379 x10E3/uL - 273 240    CMP: CMP Latest Ref Rng & Units 05/30/2018 08/29/2017 04/15/2017  Glucose 65 - 99 mg/dL 95 76 89  BUN 6 - 24 mg/dL 8 12 10   Creatinine 0.76 - 1.27 mg/dL 1.61 0.96 0.45  Sodium 134 - 144 mmol/L 140 138 141  Potassium 3.5 - 5.2 mmol/L 4.4 4.6 3.8  Chloride 96 - 106 mmol/L 104 100 107  CO2 20 - 29 mmol/L 20 19(L) 24  Calcium 8.7 - 10.2 mg/dL 40.9 9.8 9.3  Total Protein 6.0 - 8.5 g/dL 8.0 8.0 8.1(X)  Total Bilirubin 0.0 - 1.2 mg/dL 0.4 0.3 0.8  Alkaline Phos 39 - 117 IU/L 52 47 44  AST 0 - 40 IU/L 17 22 21   ALT 0 - 44 IU/L 22 23 20       Edman Circle, MD 06/16/2018, 2:09 PM  Cc: Doristine Bosworth, MD

## 2018-06-16 NOTE — Patient Instructions (Signed)
If you are age 41 or older, your body mass index should be between 23-30. Your Body mass index is 26.36 kg/m. If this is out of the aforementioned range listed, please consider follow up with your Primary Care Provider.  If you are age 41 or younger, your body mass index should be between 19-25. Your Body mass index is 26.36 kg/m. If this is out of the aformentioned range listed, please consider follow up with your Primary Care Provider.   We have sent the following medications to your pharmacy for you to pick up at your convenience: Protonix 40 mg once daily.  Thank you,  Dr. Lynann Bolognaajesh Gupta

## 2018-06-24 ENCOUNTER — Other Ambulatory Visit: Payer: Self-pay

## 2018-06-24 ENCOUNTER — Encounter: Payer: Self-pay | Admitting: Family Medicine

## 2018-06-24 ENCOUNTER — Ambulatory Visit (INDEPENDENT_AMBULATORY_CARE_PROVIDER_SITE_OTHER): Payer: 59 | Admitting: Family Medicine

## 2018-06-24 VITALS — BP 145/90 | HR 67 | Temp 98.7°F | Resp 16 | Ht 72.0 in | Wt 197.2 lb

## 2018-06-24 DIAGNOSIS — K219 Gastro-esophageal reflux disease without esophagitis: Secondary | ICD-10-CM | POA: Diagnosis not present

## 2018-06-24 DIAGNOSIS — F1090 Alcohol use, unspecified, uncomplicated: Secondary | ICD-10-CM

## 2018-06-24 DIAGNOSIS — Z789 Other specified health status: Secondary | ICD-10-CM

## 2018-06-24 DIAGNOSIS — R030 Elevated blood-pressure reading, without diagnosis of hypertension: Secondary | ICD-10-CM | POA: Diagnosis not present

## 2018-06-24 DIAGNOSIS — F321 Major depressive disorder, single episode, moderate: Secondary | ICD-10-CM

## 2018-06-24 DIAGNOSIS — R1013 Epigastric pain: Secondary | ICD-10-CM | POA: Diagnosis not present

## 2018-06-24 DIAGNOSIS — F331 Major depressive disorder, recurrent, moderate: Secondary | ICD-10-CM | POA: Diagnosis not present

## 2018-06-24 MED ORDER — PANTOPRAZOLE SODIUM 40 MG PO TBEC: 40.0000 mg | DELAYED_RELEASE_TABLET | Freq: Every day | ORAL | 3 refills | Status: DC

## 2018-06-24 MED ORDER — CITALOPRAM HYDROBROMIDE 20 MG PO TABS: 20.0000 mg | ORAL_TABLET | Freq: Every day | ORAL | 3 refills | Status: DC

## 2018-06-24 NOTE — Patient Instructions (Addendum)
   If you have lab work done today you will be contacted with your lab results within the next 2 weeks.  If you have not heard from us then please contact us. The fastest way to get your results is to register for My Chart.   IF you received an x-ray today, you will receive an invoice from [facility]. Please contact [facility] at [phone] with questions or concerns regarding your invoice.   IF you received labwork today, you will receive an invoice from [facility]. Please contact [facility] at [phone] with questions or concerns regarding your invoice.   Our billing staff will not be able to assist you with questions regarding bills from these companies.  You will be contacted with the lab results as soon as they are available. The fastest way to get your results is to activate your My Chart account. Instructions are located on the last page of this paperwork. If you have not heard from us regarding the results in 2 weeks, please contact this office.      DASH Eating Plan DASH stands for "Dietary Approaches to Stop Hypertension." The DASH eating plan is a healthy eating plan that has been shown to reduce high blood pressure (hypertension). It may also reduce your risk for type 2 diabetes, heart disease, and stroke. The DASH eating plan may also help with weight loss. What are tips for following this plan? General guidelines  Avoid eating more than 2,300 mg (milligrams) of salt (sodium) a day. If you have hypertension, you may need to reduce your sodium intake to 1,500 mg a day.  Limit alcohol intake to no more than 1 drink a day for nonpregnant women and 2 drinks a day for men. One drink equals 12 oz of beer, 5 oz of wine, or 1 oz of hard liquor.  Work with your health care provider to maintain a healthy body weight or to lose weight. Ask what an ideal weight is for you.  Get at least 30 minutes of exercise that causes your heart to beat faster (aerobic  exercise) most days of the week. Activities may include walking, swimming, or biking.  Work with your health care provider or diet and nutrition specialist (dietitian) to adjust your eating plan to your individual calorie needs. Reading food labels  Check food labels for the amount of sodium per serving. Choose foods with less than 5 percent of the Daily Value of sodium. Generally, foods with less than 300 mg of sodium per serving fit into this eating plan.  To find whole grains, look for the word "whole" as the first word in the ingredient list. Shopping  Buy products labeled as "low-sodium" or "no salt added."  Buy fresh foods. Avoid canned foods and premade or frozen meals. Cooking  Avoid adding salt when cooking. Use salt-free seasonings or herbs instead of table salt or sea salt. Check with your health care provider or pharmacist before using salt substitutes.  Do not fry foods. Cook foods using healthy methods such as baking, boiling, grilling, and broiling instead.  Cook with heart-healthy oils, such as olive, canola, soybean, or sunflower oil. Meal planning   Eat a balanced diet that includes: ? 5 or more servings of fruits and vegetables each day. At each meal, try to fill half of your plate with fruits and vegetables. ? Up to 6-8 servings of whole grains each day. ? Less than 6 oz of lean meat, poultry, or fish each day. A 3-oz serving   of meat is about the same size as a deck of cards. One egg equals 1 oz. ? 2 servings of low-fat dairy each day. ? A serving of nuts, seeds, or beans 5 times each week. ? Heart-healthy fats. Healthy fats called Omega-3 fatty acids are found in foods such as flaxseeds and coldwater fish, like sardines, salmon, and mackerel.  Limit how much you eat of the following: ? Canned or prepackaged foods. ? Food that is high in trans fat, such as fried foods. ? Food that is high in saturated fat, such as fatty meat. ? Sweets, desserts, sugary drinks,  and other foods with added sugar. ? Full-fat dairy products.  Do not salt foods before eating.  Try to eat at least 2 vegetarian meals each week.  Eat more home-cooked food and less restaurant, buffet, and fast food.  When eating at a restaurant, ask that your food be prepared with less salt or no salt, if possible. What foods are recommended? The items listed may not be a complete list. Talk with your dietitian about what dietary choices are best for you. Grains Whole-grain or whole-wheat bread. Whole-grain or whole-wheat pasta. Brown rice. Oatmeal. Quinoa. Bulgur. Whole-grain and low-sodium cereals. Pita bread. Low-fat, low-sodium crackers. Whole-wheat flour tortillas. Vegetables Fresh or frozen vegetables (raw, steamed, roasted, or grilled). Low-sodium or reduced-sodium tomato and vegetable juice. Low-sodium or reduced-sodium tomato sauce and tomato paste. Low-sodium or reduced-sodium canned vegetables. Fruits All fresh, dried, or frozen fruit. Canned fruit in natural juice (without added sugar). Meat and other protein foods Skinless chicken or turkey. Ground chicken or turkey. Pork with fat trimmed off. Fish and seafood. Egg whites. Dried beans, peas, or lentils. Unsalted nuts, nut butters, and seeds. Unsalted canned beans. Lean cuts of beef with fat trimmed off. Low-sodium, lean deli meat. Dairy Low-fat (1%) or fat-free (skim) milk. Fat-free, low-fat, or reduced-fat cheeses. Nonfat, low-sodium ricotta or cottage cheese. Low-fat or nonfat yogurt. Low-fat, low-sodium cheese. Fats and oils Soft margarine without trans fats. Vegetable oil. Low-fat, reduced-fat, or light mayonnaise and salad dressings (reduced-sodium). Canola, safflower, olive, soybean, and sunflower oils. Avocado. Seasoning and other foods Herbs. Spices. Seasoning mixes without salt. Unsalted popcorn and pretzels. Fat-free sweets. What foods are not recommended? The items listed may not be a complete list. Talk with your  dietitian about what dietary choices are best for you. Grains Baked goods made with fat, such as croissants, muffins, or some breads. Dry pasta or rice meal packs. Vegetables Creamed or fried vegetables. Vegetables in a cheese sauce. Regular canned vegetables (not low-sodium or reduced-sodium). Regular canned tomato sauce and paste (not low-sodium or reduced-sodium). Regular tomato and vegetable juice (not low-sodium or reduced-sodium). Pickles. Olives. Fruits Canned fruit in a light or heavy syrup. Fried fruit. Fruit in cream or butter sauce. Meat and other protein foods Fatty cuts of meat. Ribs. Fried meat. Bacon. Sausage. Bologna and other processed lunch meats. Salami. Fatback. Hotdogs. Bratwurst. Salted nuts and seeds. Canned beans with added salt. Canned or smoked fish. Whole eggs or egg yolks. Chicken or turkey with skin. Dairy Whole or 2% milk, cream, and half-and-half. Whole or full-fat cream cheese. Whole-fat or sweetened yogurt. Full-fat cheese. Nondairy creamers. Whipped toppings. Processed cheese and cheese spreads. Fats and oils Butter. Stick margarine. Lard. Shortening. Ghee. Bacon fat. Tropical oils, such as coconut, palm kernel, or palm oil. Seasoning and other foods Salted popcorn and pretzels. Onion salt, garlic salt, seasoned salt, table salt, and sea salt. Worcestershire sauce. Tartar sauce. Barbecue sauce.   Teriyaki sauce. Soy sauce, including reduced-sodium. Steak sauce. Canned and packaged gravies. Fish sauce. Oyster sauce. Cocktail sauce. Horseradish that you find on the shelf. Ketchup. Mustard. Meat flavorings and tenderizers. Bouillon cubes. Hot sauce and Tabasco sauce. Premade or packaged marinades. Premade or packaged taco seasonings. Relishes. Regular salad dressings. Where to find more information:  National Heart, Lung, and Blood Institute: www.nhlbi.nih.gov  American Heart Association: www.heart.org Summary  The DASH eating plan is a healthy eating plan that has  been shown to reduce high blood pressure (hypertension). It may also reduce your risk for type 2 diabetes, heart disease, and stroke.  With the DASH eating plan, you should limit salt (sodium) intake to 2,300 mg a day. If you have hypertension, you may need to reduce your sodium intake to 1,500 mg a day.  When on the DASH eating plan, aim to eat more fresh fruits and vegetables, whole grains, lean proteins, low-fat dairy, and heart-healthy fats.  Work with your health care provider or diet and nutrition specialist (dietitian) to adjust your eating plan to your individual calorie needs. This information is not intended to replace advice given to you by your health care provider. Make sure you discuss any questions you have with your health care provider. Document Released: 06/28/2011 Document Revised: 07/02/2016 Document Reviewed: 07/02/2016 Elsevier Interactive Patient Education  2018 [employer]  

## 2018-06-24 NOTE — Progress Notes (Signed)
Chief Complaint  Patient presents with  . Follow-up    Upper gastric pain, Pain is doing much better with medication    HPI  GERD  He reports that with the protonix 40mg  his pain has improved He reports that he cut back on his drinking and is only drinking every other day He does not eat breakfast and usually grabs fast food for lunch. Patient reports that he only smokes every now and again  He saw GI on 06/16/18 who agreed with protonix and advised that he follow up if symptoms got worse.   Depression  He reports that he is having more good  He reports that he has good days and bad days but more good days now on the medication His depression was complicated by grieving He denies suicidal thoughts He takes his Celexa 20mg   Depression screen [facility] 2/9 06/24/2018 05/30/2018 10/03/2017 09/05/2017 08/29/2017  Decreased Interest 0 2 1 1 2   Down, Depressed, Hopeless 1 2 1 2 1   PHQ - 2 Score 1 4 2 3 3   Altered sleeping 0 3 1 2 3   Tired, decreased energy 1 2 1 1 1   Change in appetite 1 2 1  0 2  Feeling bad or failure about yourself  0 1 0 1 2  Trouble concentrating 1 1 1 2 3   Moving slowly or fidgety/restless 0 2 1 1 2   Suicidal thoughts 0 0 0 0 1  PHQ-9 Score 4 15 7 10 17   Difficult doing work/chores Not difficult at all Very difficult Somewhat difficult - Somewhat difficult     Elevated BP without diagnosis of hypertension  BP Readings from Last 3 Encounters:  06/24/18 (!) 145/90  06/16/18 132/88  05/30/18 126/84   He denies eating breakfast Denies chest pains, palpitations He did not smoke this morning He does not feel stressed  He does not have a personal history of hypertension    Past Medical History:  Diagnosis Date  . Asthma     Current Outpatient Medications  Medication Sig Dispense Refill  . citalopram (CELEXA) 20 MG tablet Take 1 tablet (20 mg total) by mouth daily. 90 tablet 3  . pantoprazole (PROTONIX) 40 MG tablet Take 1 tablet (40 mg total) by mouth daily.  90 tablet 3   No current facility-administered medications for this visit.     Allergies: No Known Allergies  Past Surgical History:  Procedure Laterality Date  . lymph node removal      Social History   Socioeconomic History  . Marital status: Married    Spouse name: Not on file  . Number of children: Not on file  . Years of education: Not on file  . Highest education level: Not on file  Occupational History  . Not on file  Social Needs  . Financial resource strain: Not on file  . Food insecurity:    Worry: Not on file    Inability: Not on file  . Transportation needs:    Medical: Not on file    Non-medical: Not on file  Tobacco Use  . Smoking status: Former Smoker    Packs/day: 0.25    Types: Cigarettes  . Smokeless tobacco: Never Used  Substance and Sexual Activity  . Alcohol use: Yes    Alcohol/week: 0.0 standard drinks    Comment: daily use-1/2 bottle liquor  . Drug use: No  . Sexual activity: Not on file  Lifestyle  . Physical activity:    Days per week: Not on file  Minutes per session: Not on file  . Stress: Not on file  Relationships  . Social connections:    Talks on phone: Not on file    Gets together: Not on file    Attends religious service: Not on file    Active member of club or organization: Not on file    Attends meetings of clubs or organizations: Not on file    Relationship status: Not on file  Other Topics Concern  . Not on file  Social History Narrative  . Not on file    Family History  Problem Relation Age of Onset  . Hyperlipidemia Mother   . Hypertension Brother   . Kidney disease Maternal Grandmother   . Colon cancer Neg Hx   . Esophageal cancer Neg Hx      ROS Review of Systems See HPI Constitution: No fevers or chills No malaise No diaphoresis Skin: No rash or itching Eyes: no blurry vision, no double vision GU: no dysuria or hematuria Neuro: no dizziness or headaches  all others reviewed and negative     Objective: Vitals:   06/24/18 0901 06/24/18 0902  BP: (!) 155/93 (!) 145/90  Pulse: 67   Resp: 16   Temp: 98.7 F (37.1 C)   TempSrc: Oral   SpO2: 100%   Weight: 197 lb 3.2 oz (89.4 kg)   Height: 6' (1.829 m)     Physical Exam Physical Exam  Constitutional: Oriented to person, place, and time. Appears well-developed and well-nourished.  HENT:  Head: Normocephalic and atraumatic.  Eyes: Conjunctivae and EOM are normal.  Cardiovascular: Normal rate, regular rhythm, normal heart sounds and intact distal pulses.  No murmur heard. Pulmonary/Chest: Effort normal and breath sounds normal. No stridor. No respiratory distress. Has no wheezes.  Neurological: Is alert and oriented to person, place, and time.  Skin: Skin is warm. Capillary refill takes less than 2 seconds.  Psychiatric: Has a normal mood and affect. Behavior is normal. Judgment and thought content normal.   Assessment and Plan Robert Bruce was seen today for follow-up.  Diagnoses and all orders for this visit:  Gastroesophageal reflux disease, esophagitis presence not specified- improved with protonix and decreased alcohol consumption  Moderate episode of recurrent major depressive disorder (HCC)- improved, given his grieving will not increase celexa right now  Elevated BP without diagnosis of hypertension- will monitor DASH diet  Smoking cessation advised   HEAVY ALCOHOL USE- advise pt to cut down, he is beginning to cut back on drinking       [employer]

## 2018-09-06 IMAGING — CR DG CHEST 2V
2 series · 2 of 2 positions shown · non-contrast
Comparison: Radiographs January 10, 2015

CLINICAL DATA: Hematemesis.

EXAM:
CHEST  2 VIEW

[w chest pa]
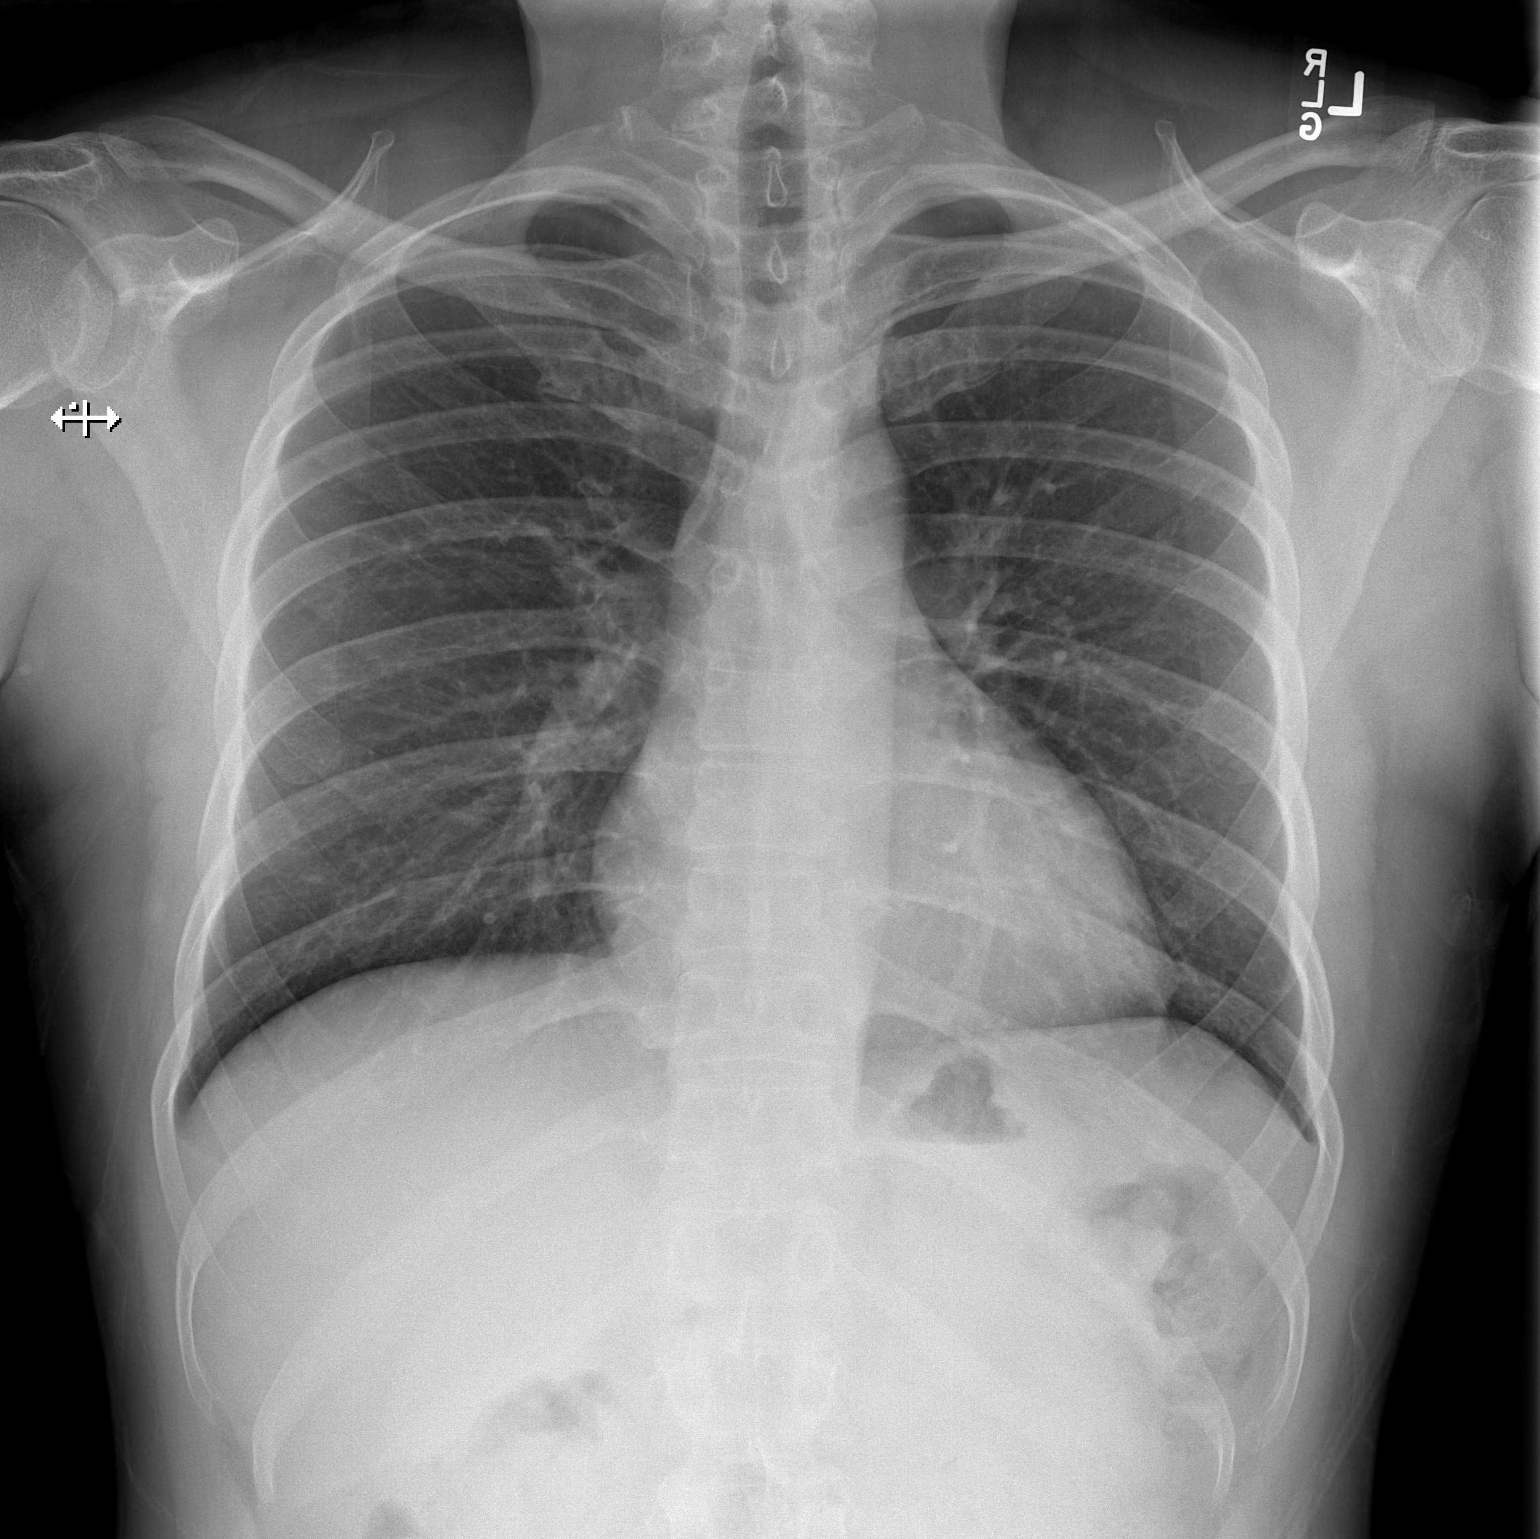

[w chest lat]
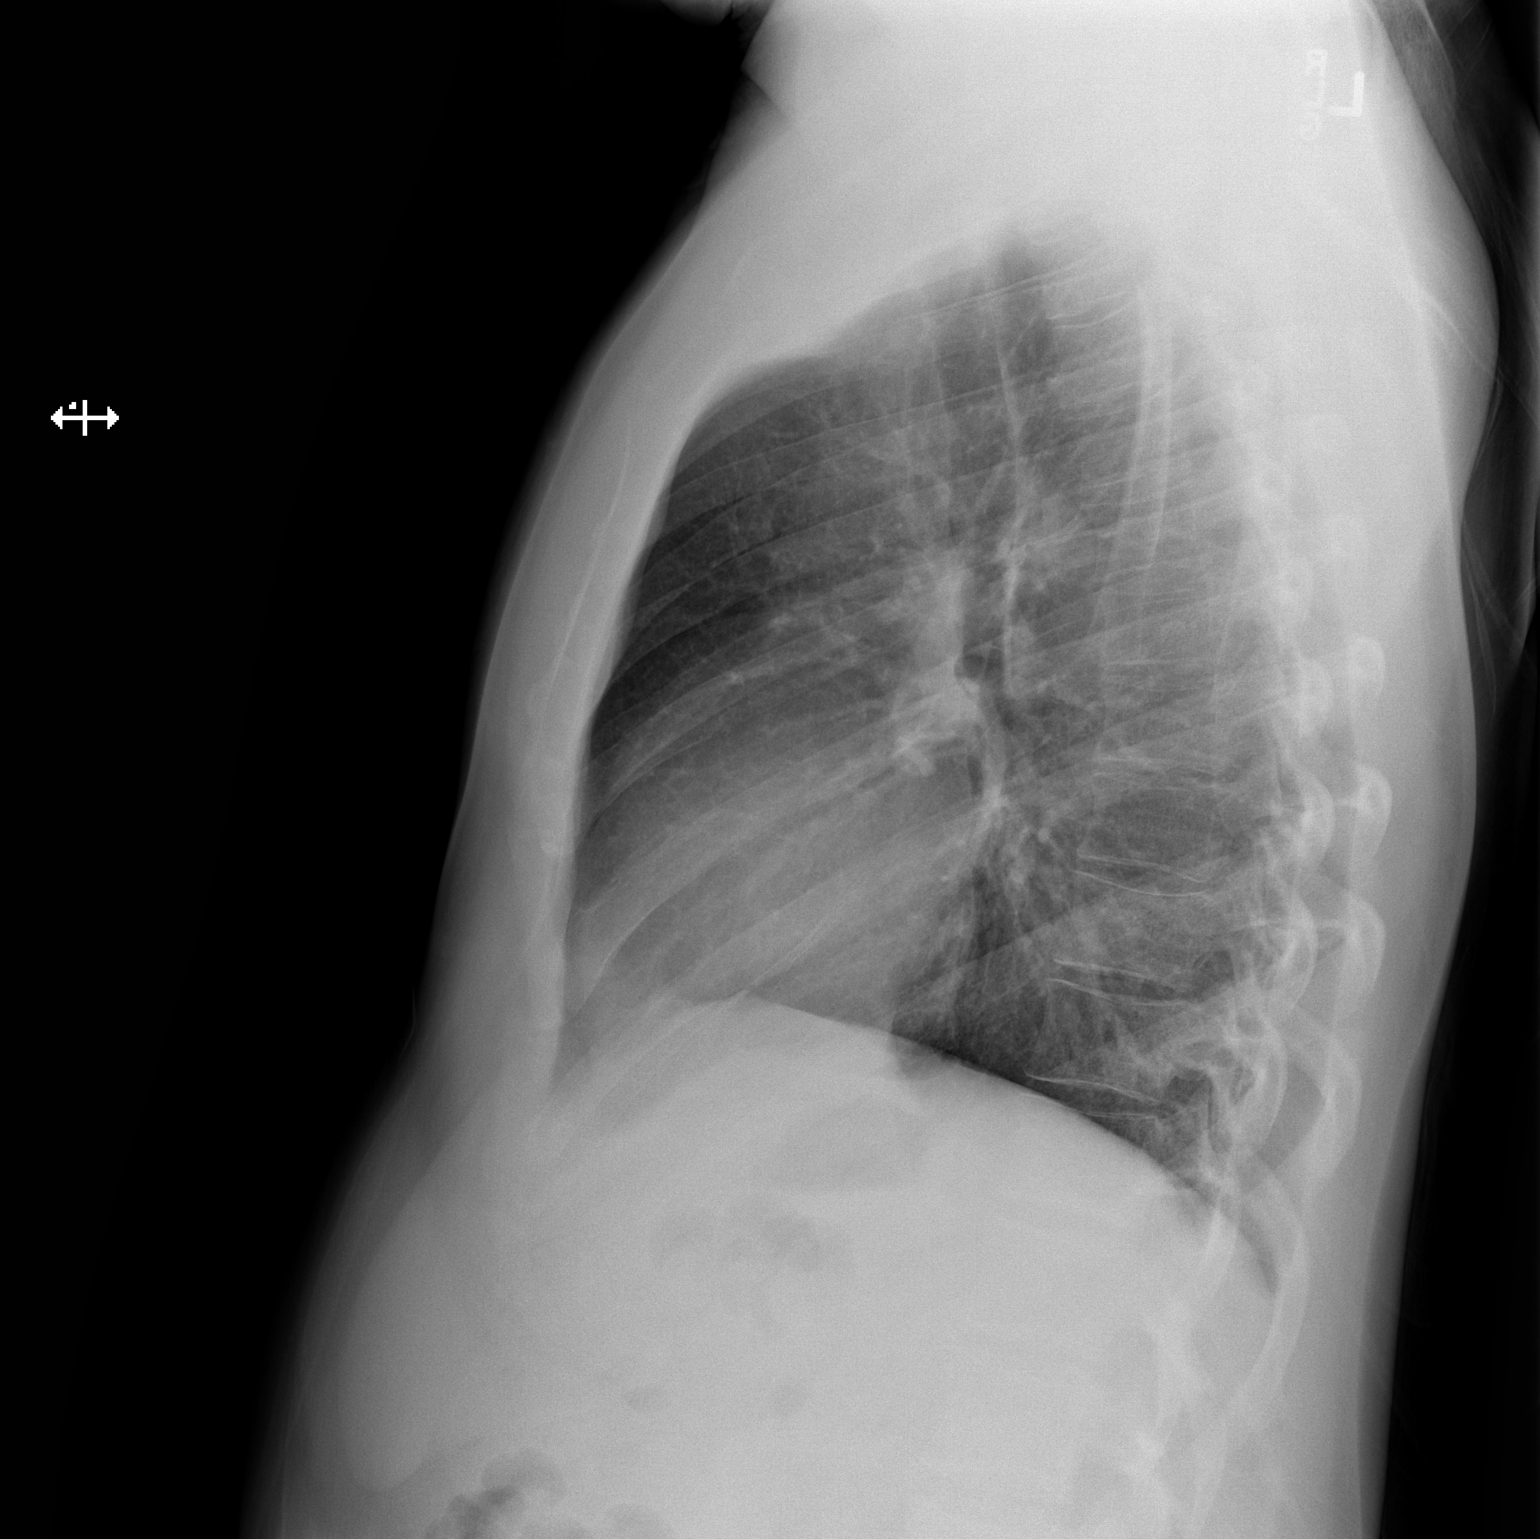

[2 of 2 positions shown; findings below may reference images not displayed]

FINDINGS: The heart size and mediastinal contours are within normal limits.
Both lungs are clear. The visualized skeletal structures are
unremarkable.
IMPRESSION: No active cardiopulmonary disease.

## 2018-09-22 ENCOUNTER — Ambulatory Visit: Payer: 59 | Admitting: Family Medicine

## 2019-08-05 IMAGING — US US ABDOMEN LIMITED
1 series · 14 of 25 positions shown · non-contrast
Comparison: None.

CLINICAL DATA: Right upper quadrant pain.

EXAM:
ULTRASOUND ABDOMEN LIMITED RIGHT UPPER QUADRANT

[Series 1: us abdomen limited · 0.22mm/px · 14 of 43 slices shown]
[im 1/43]
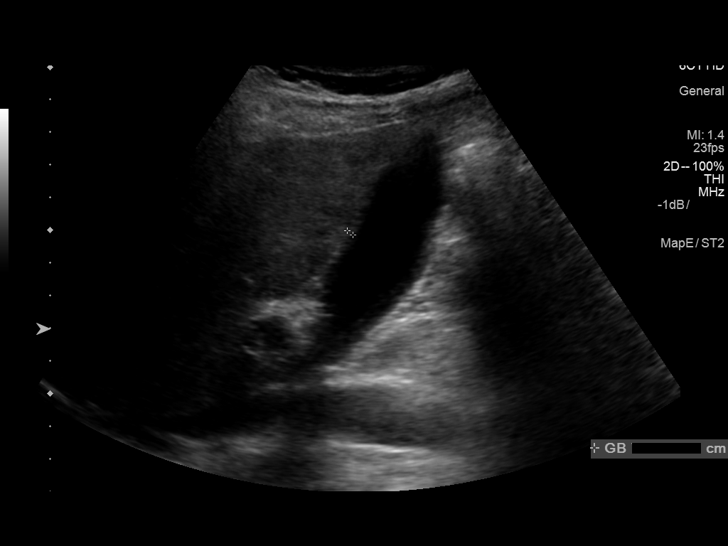
[im 4/43]
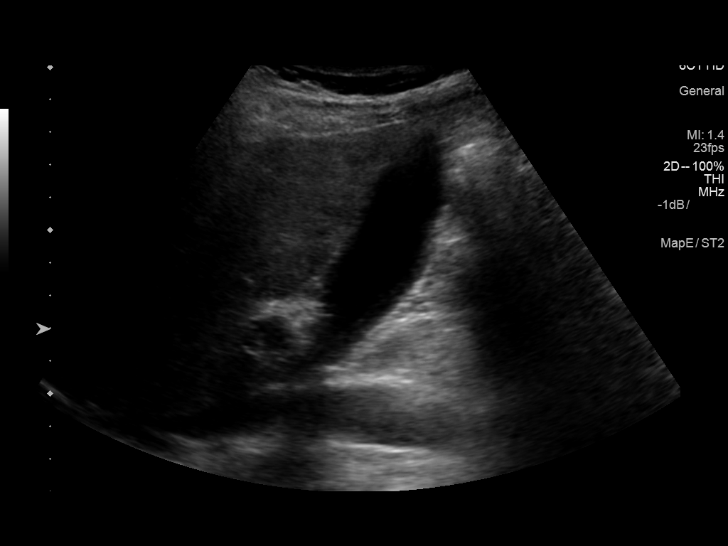
[im 8/43]
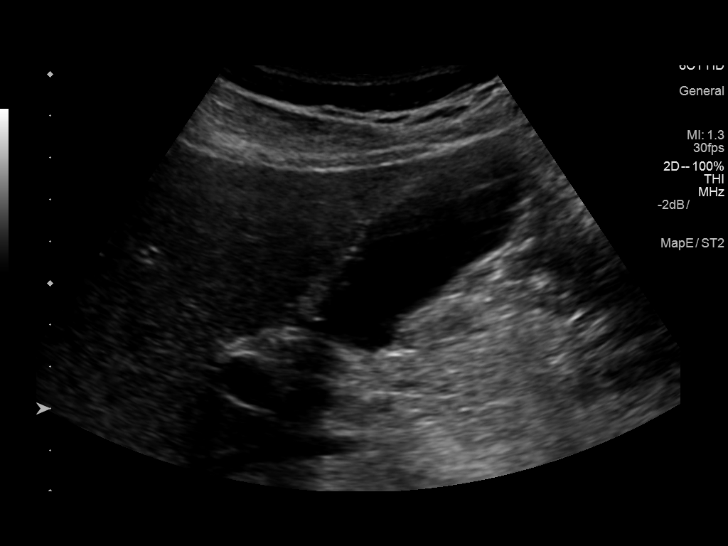
[im 11/43]
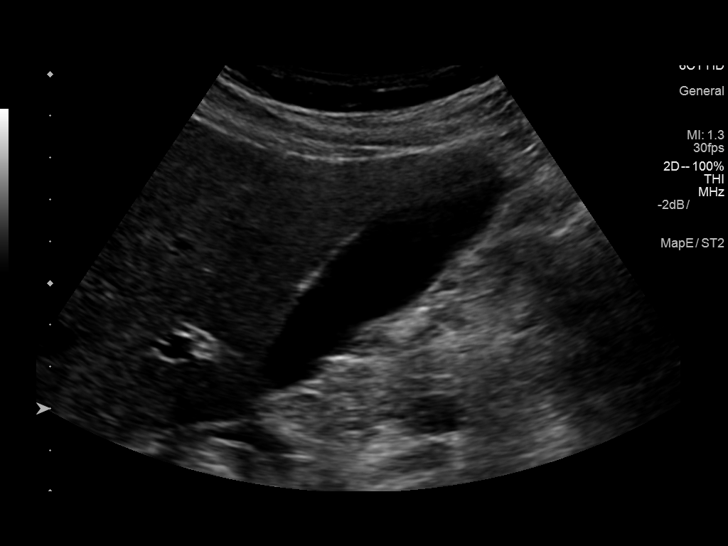
[im 15/43]
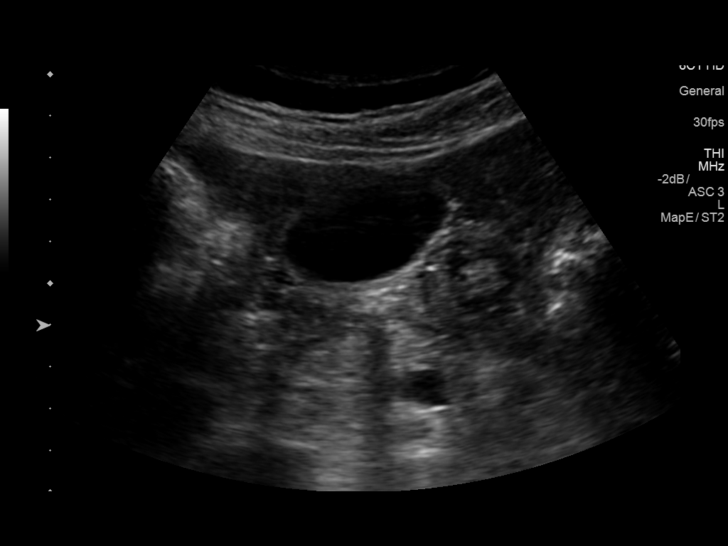
[im 16/43]
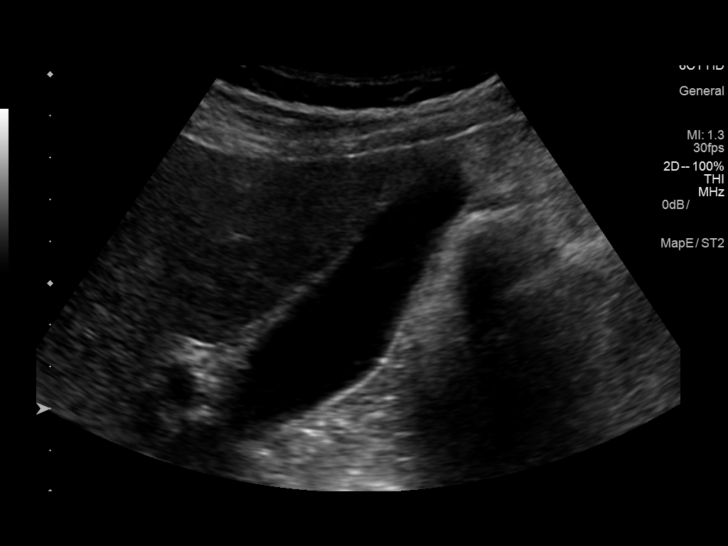
[im 20/43]
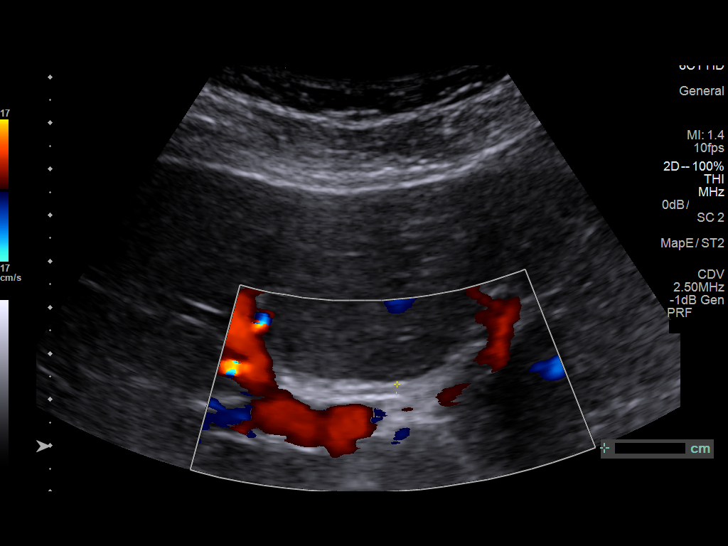
[im 23/43]
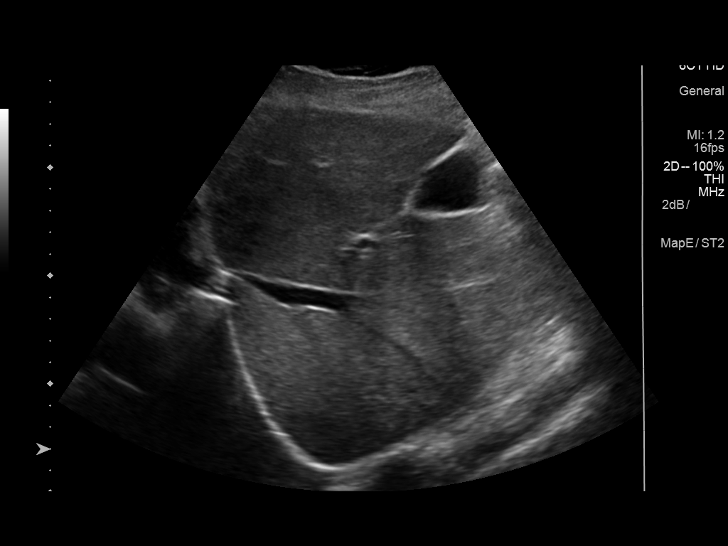
[im 27/43]
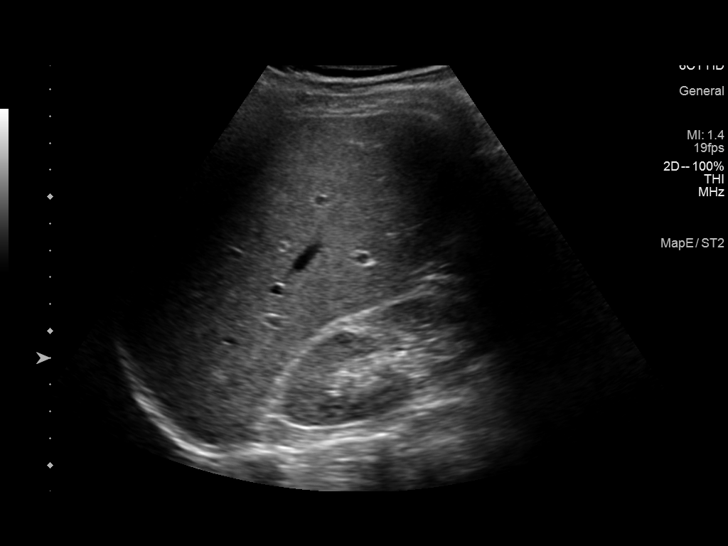
[im 29/43]
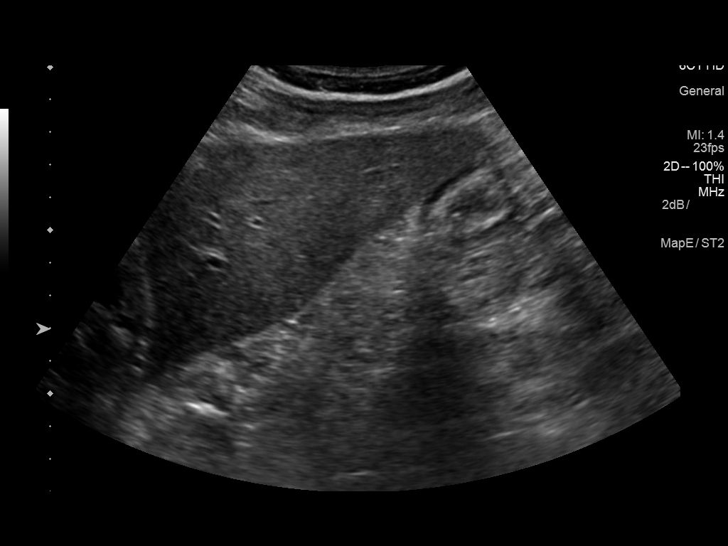
[im 32/43]
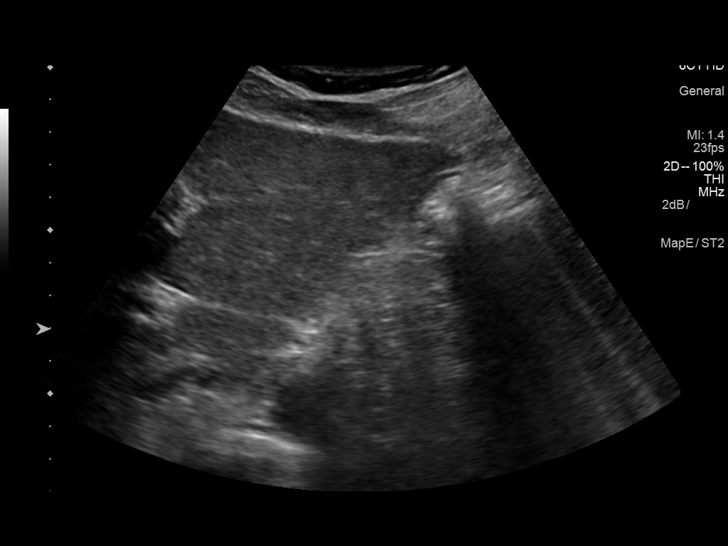
[im 36/43]
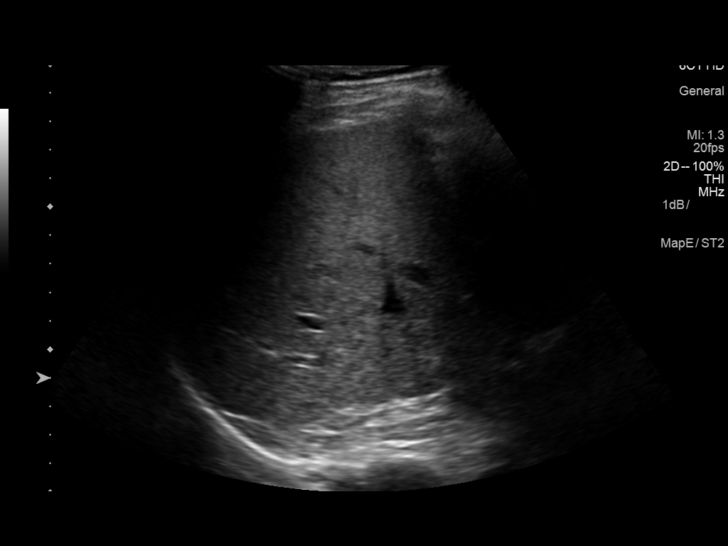
[im 39/43]
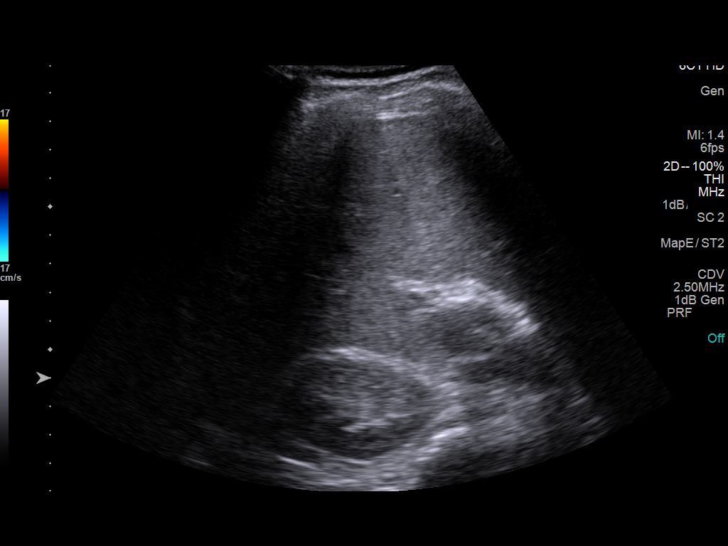
[im 43/43]
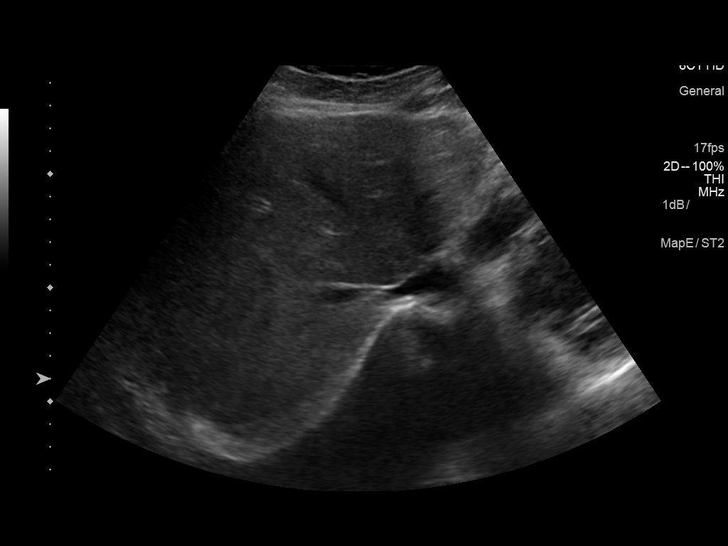

[14 of 25 positions shown; findings below may reference images not displayed]

FINDINGS: Gallbladder:

Trace sludge. No gallstones or wall thickening visualized. No
sonographic Murphy sign noted by sonographer.

Common bile duct:

Diameter: 3 mm, normal.

Liver:

No focal lesion identified. Within normal limits in parenchymal
echogenicity. Portal vein is patent on color Doppler imaging with
normal direction of blood flow towards the liver.
IMPRESSION: 1. Trace gallbladder sludge.  No acute abnormality.

## 2019-10-30 ENCOUNTER — Other Ambulatory Visit: Payer: Self-pay

## 2019-10-30 ENCOUNTER — Ambulatory Visit: Payer: 59 | Admitting: Family Medicine

## 2019-10-30 VITALS — BP 144/82 | HR 76 | Temp 97.9°F | Ht 72.0 in | Wt 194.8 lb

## 2019-10-30 DIAGNOSIS — R1013 Epigastric pain: Secondary | ICD-10-CM | POA: Diagnosis not present

## 2019-10-30 DIAGNOSIS — F321 Major depressive disorder, single episode, moderate: Secondary | ICD-10-CM

## 2019-10-30 DIAGNOSIS — I10 Essential (primary) hypertension: Secondary | ICD-10-CM | POA: Diagnosis not present

## 2019-10-30 DIAGNOSIS — F331 Major depressive disorder, recurrent, moderate: Secondary | ICD-10-CM | POA: Diagnosis not present

## 2019-10-30 DIAGNOSIS — R7989 Other specified abnormal findings of blood chemistry: Secondary | ICD-10-CM

## 2019-10-30 MED ORDER — CITALOPRAM HYDROBROMIDE 20 MG PO TABS: 20.0000 mg | ORAL_TABLET | Freq: Every day | ORAL | 3 refills | Status: DC

## 2019-10-30 MED ORDER — PANTOPRAZOLE SODIUM 40 MG PO TBEC: 40.0000 mg | DELAYED_RELEASE_TABLET | Freq: Every day | ORAL | 3 refills | Status: DC

## 2019-10-30 MED ORDER — HYDROCHLOROTHIAZIDE 12.5 MG PO TABS: 12.5000 mg | ORAL_TABLET | Freq: Every day | ORAL | 1 refills | Status: DC

## 2019-10-30 NOTE — Patient Instructions (Addendum)
   If you have lab work done today you will be contacted with your lab results within the next 2 weeks.  If you have not heard from us then please contact us. The fastest way to get your results is to register for My Chart.   IF you received an x-ray today, you will receive an invoice from [facility]. Please contact [facility] at [phone] with questions or concerns regarding your invoice.   IF you received labwork today, you will receive an invoice from [facility]. Please contact [facility] at [phone] with questions or concerns regarding your invoice.   Our billing staff will not be able to assist you with questions regarding bills from these companies.  You will be contacted with the lab results as soon as they are available. The fastest way to get your results is to activate your My Chart account. Instructions are located on the last page of this paperwork. If you have not heard from us regarding the results in 2 weeks, please contact this office.     Managing Your Hypertension Hypertension is commonly called high blood pressure. This is when the force of your blood pressing against the walls of your arteries is too strong. Arteries are blood vessels that carry blood from your heart throughout your body. Hypertension forces the heart to work harder to pump blood, and may cause the arteries to become narrow or stiff. Having untreated or uncontrolled hypertension can cause heart attack, stroke, kidney disease, and other problems. What are blood pressure readings? A blood pressure reading consists of a higher number over a lower number. Ideally, your blood pressure should be below 120/80. The first ("top") number is called the systolic pressure. It is a measure of the pressure in your arteries as your heart beats. The second ("bottom") number is called the diastolic pressure. It is a measure of the pressure in your arteries as the heart relaxes. What does my blood  pressure reading mean? Blood pressure is classified into four stages. Based on your blood pressure reading, your health care provider may use the following stages to determine what type of treatment you need, if any. Systolic pressure and diastolic pressure are measured in a unit called mm Hg. Normal  Systolic pressure: below 120.  Diastolic pressure: below 80. Elevated  Systolic pressure: 120-129.  Diastolic pressure: below 80. Hypertension stage 1  Systolic pressure: 130-139.  Diastolic pressure: 80-89. Hypertension stage 2  Systolic pressure: 140 or above.  Diastolic pressure: 90 or above. What health risks are associated with hypertension? Managing your hypertension is an important responsibility. Uncontrolled hypertension can lead to:  A heart attack.  A stroke.  A weakened blood vessel (aneurysm).  Heart failure.  Kidney damage.  Eye damage.  Metabolic syndrome.  Memory and concentration problems. What changes can I make to manage my hypertension? Hypertension can be managed by making lifestyle changes and possibly by taking medicines. Your health care provider will help you make a plan to bring your blood pressure within a normal range. Eating and drinking   Eat a diet that is high in fiber and potassium, and low in salt (sodium), added sugar, and fat. An example eating plan is called the DASH (Dietary Approaches to Stop Hypertension) diet. To eat this way: ? Eat plenty of fresh fruits and vegetables. Try to fill half of your plate at each meal with fruits and vegetables. ? Eat whole grains, such as whole wheat pasta, brown rice, or whole grain bread. Fill   about one quarter of your plate with whole grains. ? Eat low-fat diary products. ? Avoid fatty cuts of meat, processed or cured meats, and poultry with skin. Fill about one quarter of your plate with lean proteins such as fish, chicken without skin, beans, eggs, and tofu. ? Avoid premade and processed foods.  These tend to be higher in sodium, added sugar, and fat.  Reduce your daily sodium intake. Most people with hypertension should eat less than 1,500 mg of sodium a day.  Limit alcohol intake to no more than 1 drink a day for nonpregnant women and 2 drinks a day for men. One drink equals 12 oz of beer, 5 oz of wine, or 1 oz of hard liquor. Lifestyle  Work with your health care provider to maintain a healthy body weight, or to lose weight. Ask what an ideal weight is for you.  Get at least 30 minutes of exercise that causes your heart to beat faster (aerobic exercise) most days of the week. Activities may include walking, swimming, or biking.  Include exercise to strengthen your muscles (resistance exercise), such as weight lifting, as part of your weekly exercise routine. Try to do these types of exercises for 30 minutes at least 3 days a week.  Do not use any products that contain nicotine or tobacco, such as cigarettes and e-cigarettes. If you need help quitting, ask your health care provider.  Control any long-term (chronic) conditions you have, such as high cholesterol or diabetes. Monitoring  Monitor your blood pressure at home as told by your health care provider. Your personal target blood pressure may vary depending on your medical conditions, your age, and other factors.  Have your blood pressure checked regularly, as often as told by your health care provider. Working with your health care provider  Review all the medicines you take with your health care provider because there may be side effects or interactions.  Talk with your health care provider about your diet, exercise habits, and other lifestyle factors that may be contributing to hypertension.  Visit your health care provider regularly. Your health care provider can help you create and adjust your plan for managing hypertension. Will I need medicine to control my blood pressure? Your health care provider may prescribe  medicine if lifestyle changes are not enough to get your blood pressure under control, and if:  Your systolic blood pressure is 130 or higher.  Your diastolic blood pressure is 80 or higher. Take medicines only as told by your health care provider. Follow the directions carefully. Blood pressure medicines must be taken as prescribed. The medicine does not work as well when you skip doses. Skipping doses also puts you at risk for problems. Contact a health care provider if:  You think you are having a reaction to medicines you have taken.  You have repeated (recurrent) headaches.  You feel dizzy.  You have swelling in your ankles.  You have trouble with your vision. Get help right away if:  You develop a severe headache or confusion.  You have unusual weakness or numbness, or you feel faint.  You have severe pain in your chest or abdomen.  You vomit repeatedly.  You have trouble breathing. Summary  Hypertension is when the force of blood pumping through your arteries is too strong. If this condition is not controlled, it may put you at risk for serious complications.  Your personal target blood pressure may vary depending on your medical conditions, your age, and other   factors. For most people, a normal blood pressure is less than 120/80.  Hypertension is managed by lifestyle changes, medicines, or both. Lifestyle changes include weight loss, eating a healthy, low-sodium diet, exercising more, and limiting alcohol. This information is not intended to replace advice given to you by your health care provider. Make sure you discuss any questions you have with your health care provider. Document Revised: 10/31/2018 Document Reviewed: 06/06/2016 Elsevier Patient Education  2020 Elsevier Inc.  

## 2019-10-30 NOTE — Progress Notes (Signed)
Established Patient Office Visit  Subjective:  Patient ID: Robert Bruce, male    DOB: 07-11-1976  Age: 44 y.o. MRN: [id number]  CC:  Chief Complaint  Patient presents with  . Medication Refill    HPI Robert Bruce presents for   Patient is here to follow up on his   Elevated BP He gets a few high readings of his blood pressure Getting as high as 485 systolic He states that he can tell when his bp is getting high because he has a headache and dizziness BP Readings from Last 3 Encounters:  10/30/19 (!) 144/82  06/24/18 (!) 145/90  06/16/18 132/88    GERD He smokes 1-2 times a day.  He states that with bending to do work he gets some burning pain in his stomach area. He has not tried any otc antacids He denies changes to his BM  Depression  He reports that his mood was up and down in February  He is sleeping better now  He states that he is taking Celexa 38m [facility] 10/30/2019 06/24/2018 05/30/2018 10/03/2017 09/05/2017  Decreased Interest 0 0 _0 Down, Depressed, Hopeless 0 _1 PHQ - 2 Score 0 _2 Altered sleeping - 0 _3 Tired, decreased energy - _4 Change in appetite - _5 0  Feeling bad or failure about yourself  - 0 1 0 1  Trouble concentrating - _6 Moving slowly or fidgety/restless - 0 _7 Suicidal thoughts - 0 0 0 0  PHQ-9 Score - _8 Difficult doing work/chores - Not difficult at all Very difficult Somewhat difficult -     Past Medical History:  Diagnosis Date  . Asthma     Past Surgical History:  Procedure Laterality Date  . lymph node removal      Family History  Problem Relation Age of Onset  . Hyperlipidemia Mother   . Hypertension Brother   . Kidney disease Maternal Grandmother   . Colon cancer Neg Hx   . Esophageal cancer Neg Hx     Social History   Socioeconomic History  . Marital status: Married    Spouse name: Not on file  . Number of children: Not on file   . Years of education: Not on file  . Highest education level: Not on file  Occupational History  . Not on file  Tobacco Use  . Smoking status: Former Smoker    Packs/day: 0.25    Types: Cigarettes  . Smokeless tobacco: Never Used  Substance and Sexual Activity  . Alcohol use: Yes    Alcohol/week: 0.0 standard drinks    Comment: daily use-1/2 bottle liquor  . Drug use: No  . Sexual activity: Not on file  Other Topics Concern  . Not on file  Social History Narrative  . Not on file   Social Determinants of Health   Financial Resource Strain:   . Difficulty of Paying Living Expenses:   Food Insecurity:   . Worried About RCharity fundraiserin the Last Year:   . RArboriculturistin the Last Year:   Transportation Needs:   . LFilm/video editor(Medical):   .Marland KitchenLack of Transportation (Non-Medical):   Physical Activity:   . Days of Exercise per Week:   . Minutes of Exercise per Session:  Stress:   . Feeling of Stress :   Social Connections:   . Frequency of Communication with Friends and Family:   . Frequency of Social Gatherings with Friends and Family:   . Attends Religious Services:   . Active Member of Clubs or Organizations:   . Attends Archivist Meetings:   Marland Kitchen Marital Status:   Intimate Partner Violence:   . Fear of Current or Ex-Partner:   . Emotionally Abused:   Marland Kitchen Physically Abused:   . Sexually Abused:     Outpatient Medications Prior to Visit  Medication Sig Dispense Refill  . citalopram (CELEXA) 20 MG tablet Take 1 tablet (20 mg total) by mouth daily. 90 tablet 3  . pantoprazole (PROTONIX) 40 MG tablet Take 1 tablet (40 mg total) by mouth daily. 90 tablet 3   No facility-administered medications prior to visit.    No Known Allergies  ROS Review of Systems    Objective:    Physical Exam  BP (!) 144/82 (BP Location: Left Arm, Patient Position: Sitting, Cuff Size: Large)   Pulse 76   Temp 97.9 F (36.6 C) (Temporal)   Ht 6' (1.829 m)    Wt 194 lb 12.8 oz (88.4 kg)   SpO2 99%   BMI 26.42 kg/m  Wt Readings from Last 3 Encounters:  10/30/19 194 lb 12.8 oz (88.4 kg)  06/24/18 197 lb 3.2 oz (89.4 kg)  06/16/18 194 lb 6 oz (88.2 kg)     Health Maintenance Due  Topic Date Due  . HIV Screening  Never done    There are no preventive care reminders to display for this patient.  Lab Results  Component Value Date   TSH 1.360 10/30/2019   Lab Results  Component Value Date   WBC 5.7 05/30/2018   HGB 14.7 (A) 05/30/2018   HCT 42.8 (A) 05/30/2018   MCV 88.0 05/30/2018   PLT 273 08/29/2017   Lab Results  Component Value Date   NA 141 10/30/2019   K 4.9 10/30/2019   CO2 21 10/30/2019   GLUCOSE 91 10/30/2019   BUN 11 10/30/2019   CREATININE 0.94 10/30/2019   BILITOT 0.4 10/30/2019   ALKPHOS 56 10/30/2019   AST 26 10/30/2019   ALT 25 10/30/2019   PROT 8.0 10/30/2019   ALBUMIN 5.2 (H) 10/30/2019   CALCIUM 10.0 10/30/2019   ANIONGAP 10 04/15/2017   Lab Results  Component Value Date   CHOL 279 (H) 10/30/2019   Lab Results  Component Value Date   HDL 56 10/30/2019   Lab Results  Component Value Date   LDLCALC 203 (H) 10/30/2019   Lab Results  Component Value Date   TRIG 114 10/30/2019   Lab Results  Component Value Date   CHOLHDL 5.0 10/30/2019   Lab Results  Component Value Date   HGBA1C 5.6 05/30/2018      Assessment & Plan:   Problem List Items Addressed This Visit    None    Visit Diagnoses    Moderate episode of recurrent major depressive disorder ([place])    -  Primary   Relevant Medications   citalopram (CELEXA) 20 MG tablet   Moderate major depression (HCC)       Relevant Medications   citalopram (CELEXA) 20 MG tablet   Recurrent epigastric abdominal pain       Relevant Medications   citalopram (CELEXA) 20 MG tablet   pantoprazole (PROTONIX) 40 MG tablet   Essential hypertension  Relevant Medications   hydrochlorothiazide (HYDRODIURIL) 12.5 MG tablet   Other Relevant  Orders   CMP14+EGFR (Completed)   TSH (Completed)   Lipid panel (Completed)   Microalbumin, urine (Completed)   Abnormal TSH       Relevant Orders   TSH (Completed)     Reviewed behavioral health Discussed labs and continued medications  htn - continue hctz tsh abnormal - tsh was borderline, so recheck Epigastric abdominal pain - discussed continuing protonix Depression - continue celexa   Meds ordered this encounter  Medications  . citalopram (CELEXA) 20 MG tablet    Sig: Take 1 tablet (20 mg total) by mouth daily.    Dispense:  90 tablet    Refill:  3  . pantoprazole (PROTONIX) 40 MG tablet    Sig: Take 1 tablet (40 mg total) by mouth daily.    Dispense:  90 tablet    Refill:  3  . hydrochlorothiazide (HYDRODIURIL) 12.5 MG tablet    Sig: Take 1 tablet (12.5 mg total) by mouth daily.    Dispense:  90 tablet    Refill:  1    Follow-up: No follow-ups on file.    Forrest Moron, MD

## 2019-10-31 ENCOUNTER — Other Ambulatory Visit: Payer: Self-pay | Admitting: Family Medicine

## 2019-10-31 LAB — CMP14+EGFR
ALT: 25 IU/L (ref 0–44)
AST: 26 IU/L (ref 0–40)
Albumin/Globulin Ratio: 1.9 (ref 1.2–2.2)
Albumin: 5.2 g/dL — ABNORMAL HIGH (ref 4.0–5.0)
Alkaline Phosphatase: 56 IU/L (ref 39–117)
BUN/Creatinine Ratio: 12 (ref 9–20)
BUN: 11 mg/dL (ref 6–24)
Bilirubin Total: 0.4 mg/dL (ref 0.0–1.2)
CO2: 21 mmol/L (ref 20–29)
Calcium: 10 mg/dL (ref 8.7–10.2)
Chloride: 101 mmol/L (ref 96–106)
Creatinine, Ser: 0.94 mg/dL (ref 0.76–1.27)
GFR calc Af Amer: 115 mL/min/{1.73_m2} (ref >59)
GFR calc non Af Amer: 100 mL/min/{1.73_m2} (ref >59)
Globulin, Total: 2.8 g/dL (ref 1.5–4.5)
Glucose: 91 mg/dL (ref 65–99)
Potassium: 4.9 mmol/L (ref 3.5–5.2)
Sodium: 141 mmol/L (ref 134–144)
Total Protein: 8 g/dL (ref 6.0–8.5)

## 2019-10-31 LAB — LIPID PANEL
Chol/HDL Ratio: 5 ratio (ref 0.0–5.0)
Cholesterol, Total: 279 mg/dL — ABNORMAL HIGH (ref 100–199)
HDL: 56 mg/dL (ref >39)
LDL Chol Calc (NIH): 203 mg/dL — ABNORMAL HIGH (ref 0–99)
Triglycerides: 114 mg/dL (ref 0–149)
VLDL Cholesterol Cal: 20 mg/dL (ref 5–40)

## 2019-10-31 LAB — MICROALBUMIN, URINE: Microalbumin, Urine: 28.7 ug/mL

## 2019-10-31 LAB — TSH: TSH: 1.36 u[IU]/mL (ref 0.450–4.500)

## 2019-10-31 MED ORDER — ATORVASTATIN CALCIUM 10 MG PO TABS: 10.0000 mg | ORAL_TABLET | Freq: Every day | ORAL | 3 refills | Status: DC

## 2021-12-29 ENCOUNTER — Encounter (HOSPITAL_BASED_OUTPATIENT_CLINIC_OR_DEPARTMENT_OTHER): Payer: Self-pay

## 2021-12-29 ENCOUNTER — Emergency Department (HOSPITAL_BASED_OUTPATIENT_CLINIC_OR_DEPARTMENT_OTHER)
Admission: EM | Admit: 2021-12-29 | Discharge: 2021-12-29 | Disposition: A | Discharge status: Home / Self Care | Payer: 59 | Attending: Emergency Medicine | Admitting: Emergency Medicine

## 2021-12-29 DIAGNOSIS — R202 Paresthesia of skin: Secondary | ICD-10-CM | POA: Diagnosis not present

## 2021-12-29 DIAGNOSIS — R200 Anesthesia of skin: Secondary | ICD-10-CM | POA: Diagnosis present

## 2021-12-29 HISTORY — DX: Essential (primary) hypertension: I10

## 2021-12-29 NOTE — ED Provider Notes (Signed)
[facility] EMERGENCY DEPARTMENT Provider Note   CSN: [id number] Arrival date & time: 12/29/21  0846     History  Chief Complaint  Patient presents with   Numbness    Robert Bruce is a 46 y.o. male.  46 yo M with a chief complaints of feeling like his hand is numb and tingly on the left side.  It is mostly in the hand but he feels some moving towards his elbow.  He denies headache neck pain denies trauma to the arm.  He noticed this yesterday while he was at work.  He works as a Curator.  Denies one-sided weakness denies difficulty with speech or swallowing.        Home Medications Prior to Admission medications   Medication Sig Start Date End Date Taking? Authorizing Provider  atorvastatin (LIPITOR) 10 MG tablet Take 1 tablet (10 mg total) by mouth daily. 10/31/19   Doristine Bosworth, MD  citalopram (CELEXA) 20 MG tablet Take 1 tablet (20 mg total) by mouth daily. 10/30/19   Doristine Bosworth, MD  hydrochlorothiazide (HYDRODIURIL) 12.5 MG tablet Take 1 tablet (12.5 mg total) by mouth daily. 10/30/19   Doristine Bosworth, MD  pantoprazole (PROTONIX) 40 MG tablet Take 1 tablet (40 mg total) by mouth daily. 10/30/19   Doristine Bosworth, MD      Allergies    Patient has no known allergies.    Review of Systems   Review of Systems  Physical Exam Updated Vital Signs BP (!) 159/102 (BP Location: Left Arm)   Pulse 78   Temp 98 F (36.7 C) (Oral)   Resp 18   Ht 6\' 1"  (1.854 m)   Wt 86.2 kg   SpO2 100%   BMI 25.07 kg/m  Physical Exam Vitals and nursing note reviewed.  Constitutional:      Appearance: He is well-developed.  HENT:     Head: Normocephalic and atraumatic.  Eyes:     Pupils: Pupils are equal, round, and reactive to light.  Neck:     Vascular: No JVD.  Cardiovascular:     Rate and Rhythm: Normal rate and regular rhythm.     Heart sounds: No murmur heard.    No friction rub. No gallop.  Pulmonary:     Effort: No respiratory distress.      Breath sounds: No wheezing.  Abdominal:     General: There is no distension.     Tenderness: There is no abdominal tenderness. There is no guarding or rebound.  Musculoskeletal:        General: Normal range of motion.     Cervical back: Normal range of motion and neck supple.  Skin:    Coloration: Skin is not pale.     Findings: No rash.  Neurological:     Mental Status: He is alert and oriented to person, place, and time.     Cranial Nerves: Cranial nerves 2-12 are intact.     Sensory: Sensation is intact.     Motor: Motor function is intact.     Coordination: Coordination is intact.     Comments: Benign neurologic exam.  Ambulates without issue.  Pulse motor and sensation intact in the left upper extremity.  Positive Tinel's test with percussion of the ulnar canal.  Psychiatric:        Behavior: Behavior normal.     ED Results / Procedures / Treatments   Labs (all labs ordered are listed, but only abnormal results are  displayed) Labs Reviewed - No data to display  EKG None  Radiology No results found.  Procedures Procedures    Medications Ordered in ED Medications - No data to display  ED Course/ Medical Decision Making/ A&P                           Medical Decision Making  46 yo M with a chief complaints of tingling to the left upper extremity mostly in the hands but also extending up to the elbow.  Most likely paresthesia by history.  He has benign neurologic exam.  Has intact light sensation in all nerve distributions in the hand.  It was documented by nursing that the patient was having a headache as well.  He initially said no when I pressed him on this he said may be very mild.  Will place in a sling for comfort.  Given neurology follow-up.  PCP follow-up.  9:19 AM:  I have discussed the diagnosis/risks/treatment options with the patient.  Evaluation and diagnostic testing in the emergency department does not suggest an emergent condition requiring admission or  immediate intervention beyond what has been performed at this time.  They will follow up with Neuro. We also discussed returning to the ED immediately if new or worsening sx occur. We discussed the sx which are most concerning (e.g., sudden worsening pain, fever, inability to tolerate by mouth, weakness, difficulty with speech or swallowing, headache) that necessitate immediate return. Medications administered to the patient during their visit and any new prescriptions provided to the patient are listed below.  Medications given during this visit Medications - No data to display   The patient appears reasonably screen and/or stabilized for discharge and I doubt any other medical condition or other [facility] requiring further screening, evaluation, or treatment in the ED at this time prior to discharge.          Final Clinical Impression(s) / ED Diagnoses Final diagnoses:  Paresthesia    Rx / DC Orders ED Discharge Orders          Ordered    Ambulatory referral to Neurology       Comments: Paresthesia LUE   12/29/21 Prichard, Hartwell, DO 12/29/21 [id number]

## 2021-12-29 NOTE — ED Triage Notes (Signed)
States yesterday afternoon around 1400 he started having numbness/tingling in left hand up to elbow. Also has slight headache. VAN negative.  Hasnt taken antihypertensives since February

## 2021-12-29 NOTE — Discharge Instructions (Addendum)
Take 4 over the counter ibuprofen tablets 3 times a day or 2 over-the-counter naproxen tablets twice a day for pain. Also take tylenol 1000mg(2 extra strength) four times a day.    

## 2021-12-29 NOTE — ED Notes (Signed)
Provider is aware of patients blood pressure. Patient denies any s/s of hyprtension.

## 2022-07-18 ENCOUNTER — Encounter: Payer: Self-pay | Admitting: Family Medicine

## 2022-07-18 ENCOUNTER — Ambulatory Visit (INDEPENDENT_AMBULATORY_CARE_PROVIDER_SITE_OTHER): Payer: 59

## 2022-07-18 ENCOUNTER — Ambulatory Visit (INDEPENDENT_AMBULATORY_CARE_PROVIDER_SITE_OTHER): Payer: 59 | Admitting: Family Medicine

## 2022-07-18 ENCOUNTER — Telehealth: Payer: Self-pay

## 2022-07-18 VITALS — BP 140/100 | HR 97 | Temp 98.3°F | Ht 73.0 in | Wt 194.0 lb

## 2022-07-18 DIAGNOSIS — K920 Hematemesis: Secondary | ICD-10-CM | POA: Diagnosis not present

## 2022-07-18 DIAGNOSIS — I10 Essential (primary) hypertension: Secondary | ICD-10-CM | POA: Insufficient documentation

## 2022-07-18 DIAGNOSIS — E785 Hyperlipidemia, unspecified: Secondary | ICD-10-CM | POA: Diagnosis not present

## 2022-07-18 DIAGNOSIS — R110 Nausea: Secondary | ICD-10-CM

## 2022-07-18 DIAGNOSIS — R1013 Epigastric pain: Secondary | ICD-10-CM

## 2022-07-18 DIAGNOSIS — Z789 Other specified health status: Secondary | ICD-10-CM

## 2022-07-18 DIAGNOSIS — E7800 Pure hypercholesterolemia, unspecified: Secondary | ICD-10-CM | POA: Insufficient documentation

## 2022-07-18 DIAGNOSIS — R9431 Abnormal electrocardiogram [ECG] [EKG]: Secondary | ICD-10-CM

## 2022-07-18 DIAGNOSIS — F321 Major depressive disorder, single episode, moderate: Secondary | ICD-10-CM | POA: Diagnosis not present

## 2022-07-18 DIAGNOSIS — F17200 Nicotine dependence, unspecified, uncomplicated: Secondary | ICD-10-CM

## 2022-07-18 LAB — LIPID PANEL
Cholesterol: 330 mg/dL — ABNORMAL HIGH (ref 0–200)
HDL: 57.9 mg/dL (ref >39.00)
LDL Cholesterol: 234 mg/dL — ABNORMAL HIGH (ref 0–99)
NonHDL: 272.41
Total CHOL/HDL Ratio: 6
Triglycerides: 194 mg/dL — ABNORMAL HIGH (ref 0.0–149.0)
VLDL: 38.8 mg/dL (ref 0.0–40.0)

## 2022-07-18 LAB — URINALYSIS, ROUTINE W REFLEX MICROSCOPIC
Hgb urine dipstick: NEGATIVE
Ketones, ur: 15 — AB
Leukocytes,Ua: NEGATIVE
Nitrite: NEGATIVE
RBC / HPF: NONE SEEN (ref 0–?)
Specific Gravity, Urine: 1.025 (ref 1.000–1.030)
Total Protein, Urine: 100 — AB
Urine Glucose: NEGATIVE
Urobilinogen, UA: 1 (ref 0.0–1.0)
pH: 6 (ref 5.0–8.0)

## 2022-07-18 LAB — CBC WITH DIFFERENTIAL/PLATELET
Basophils Absolute: 0 10*3/uL (ref 0.0–0.1)
Basophils Relative: 0.6 % (ref 0.0–3.0)
Eosinophils Absolute: 0.1 10*3/uL (ref 0.0–0.7)
Eosinophils Relative: 1.6 % (ref 0.0–5.0)
HCT: 45.7 % (ref 39.0–52.0)
Hemoglobin: 15.3 g/dL (ref 13.0–17.0)
Lymphocytes Relative: 34.4 % (ref 12.0–46.0)
Lymphs Abs: 1.9 10*3/uL (ref 0.7–4.0)
MCHC: 33.4 g/dL (ref 30.0–36.0)
MCV: 91 fl (ref 78.0–100.0)
Monocytes Absolute: 0.6 10*3/uL (ref 0.1–1.0)
Monocytes Relative: 11 % (ref 3.0–12.0)
Neutro Abs: 2.9 10*3/uL (ref 1.4–7.7)
Neutrophils Relative %: 52.4 % (ref 43.0–77.0)
Platelets: 301 10*3/uL (ref 150.0–400.0)
RBC: 5.02 Mil/uL (ref 4.22–5.81)
RDW: 14.7 % (ref 11.5–15.5)
WBC: 5.6 10*3/uL (ref 4.0–10.5)

## 2022-07-18 LAB — BASIC METABOLIC PANEL
BUN: 14 mg/dL (ref 6–23)
CO2: 28 mEq/L (ref 19–32)
Calcium: 10.2 mg/dL (ref 8.4–10.5)
Chloride: 97 mEq/L (ref 96–112)
Creatinine, Ser: 1.02 mg/dL (ref 0.40–1.50)
GFR: 88.76 mL/min (ref >60.00)
Glucose, Bld: 83 mg/dL (ref 70–99)
Potassium: 4 mEq/L (ref 3.5–5.1)
Sodium: 137 mEq/L (ref 135–145)

## 2022-07-18 LAB — PROTIME-INR
INR: 1 ratio (ref 0.8–1.0)
Prothrombin Time: 11.2 s (ref 9.6–13.1)

## 2022-07-18 LAB — HEPATIC FUNCTION PANEL
ALT: 26 U/L (ref 0–53)
AST: 32 U/L (ref 0–37)
Albumin: 5.2 g/dL (ref 3.5–5.2)
Alkaline Phosphatase: 52 U/L (ref 39–117)
Bilirubin, Direct: 0.1 mg/dL (ref 0.0–0.3)
Total Bilirubin: 0.9 mg/dL (ref 0.2–1.2)
Total Protein: 8.8 g/dL — ABNORMAL HIGH (ref 6.0–8.3)

## 2022-07-18 LAB — TSH: TSH: 3.88 u[IU]/mL (ref 0.35–5.50)

## 2022-07-18 LAB — T4, FREE: Free T4: 0.81 ng/dL (ref 0.60–1.60)

## 2022-07-18 MED ORDER — ATORVASTATIN CALCIUM 20 MG PO TABS: 20.0000 mg | ORAL_TABLET | Freq: Every day | ORAL | 1 refills | Status: DC

## 2022-07-18 MED ORDER — PANTOPRAZOLE SODIUM 40 MG PO TBEC: 40.0000 mg | DELAYED_RELEASE_TABLET | Freq: Every day | ORAL | 0 refills | Status: DC

## 2022-07-18 MED ORDER — NADOLOL 20 MG PO TABS: 20.0000 mg | ORAL_TABLET | Freq: Every day | ORAL | 0 refills | Status: DC

## 2022-07-18 MED ORDER — VALSARTAN-HYDROCHLOROTHIAZIDE 160-12.5 MG PO TABS: 1.0000 | ORAL_TABLET | Freq: Every day | ORAL | 0 refills | Status: DC

## 2022-07-18 NOTE — Telephone Encounter (Signed)
Work note was done for patient today

## 2022-07-18 NOTE — Progress Notes (Addendum)
I just got his cholesterol results. I will send in a medication to help lower his cholesterol. Please let him know to start this today or tomorrow also.

## 2022-07-18 NOTE — Patient Instructions (Signed)
Start on the prescribed medications for your blood pressure and acid reflux.   Cut back and stop alcohol.   Follow up with me in 4 weeks or sooner if you are getting worse.   Check your blood pressure at home daily and bring in your readings.     DASH Eating Plan DASH stands for Dietary Approaches to Stop Hypertension. The DASH eating plan is a healthy eating plan that has been shown to: Reduce high blood pressure (hypertension). Reduce your risk for type 2 diabetes, heart disease, and stroke. Help with weight loss. What are tips for following this plan? Reading food labels Check food labels for the amount of salt (sodium) per serving. Choose foods with less than 5 percent of the Daily Value of sodium. Generally, foods with less than 300 milligrams (mg) of sodium per serving fit into this eating plan. To find whole grains, look for the word "whole" as the first word in the ingredient list. Shopping Buy products labeled as "low-sodium" or "no salt added." Buy fresh foods. Avoid canned foods and pre-made or frozen meals. Cooking Avoid adding salt when cooking. Use salt-free seasonings or herbs instead of table salt or sea salt. Check with your health care provider or pharmacist before using salt substitutes. Do not fry foods. Cook foods using healthy methods such as baking, boiling, grilling, roasting, and broiling instead. Cook with heart-healthy oils, such as olive, canola, avocado, soybean, or sunflower oil. Meal planning  Eat a balanced diet that includes: 4 or more servings of fruits and 4 or more servings of vegetables each day. Try to fill one-half of your plate with fruits and vegetables. 6-8 servings of whole grains each day. Less than 6 oz (170 g) of lean meat, poultry, or fish each day. A 3-oz (85-g) serving of meat is about the same size as a deck of cards. One egg equals 1 oz (28 g). 2-3 servings of low-fat dairy each day. One serving is 1 cup (237 mL). 1 serving of nuts,  seeds, or beans 5 times each week. 2-3 servings of heart-healthy fats. Healthy fats called omega-3 fatty acids are found in foods such as walnuts, flaxseeds, fortified milks, and eggs. These fats are also found in cold-water fish, such as sardines, salmon, and mackerel. Limit how much you eat of: Canned or prepackaged foods. Food that is high in trans fat, such as some fried foods. Food that is high in saturated fat, such as fatty meat. Desserts and other sweets, sugary drinks, and other foods with added sugar. Full-fat dairy products. Do not salt foods before eating. Do not eat more than 4 egg yolks a week. Try to eat at least 2 vegetarian meals a week. Eat more home-cooked food and less restaurant, buffet, and fast food. Lifestyle When eating at a restaurant, ask that your food be prepared with less salt or no salt, if possible. If you drink alcohol: Limit how much you use to: 0-1 drink a day for women who are not pregnant. 0-2 drinks a day for men. Be aware of how much alcohol is in your drink. In the [place] one drink equals one 12 oz bottle of beer (355 mL), one 5 oz glass of wine (148 mL), or one 1 oz glass of hard liquor (44 mL). General information Avoid eating more than 2,300 mg of salt a day. If you have hypertension, you may need to reduce your sodium intake to 1,500 mg a day. Work with your health care provider to maintain  a healthy body weight or to lose weight. Ask what an ideal weight is for you. Get at least 30 minutes of exercise that causes your heart to beat faster (aerobic exercise) most days of the week. Activities may include walking, swimming, or biking. Work with your health care provider or dietitian to adjust your eating plan to your individual calorie needs. What foods should I eat? Fruits All fresh, dried, or frozen fruit. Canned fruit in natural juice (without added sugar). Vegetables Fresh or frozen vegetables (raw, steamed, roasted, or grilled). Low-sodium or  reduced-sodium tomato and vegetable juice. Low-sodium or reduced-sodium tomato sauce and tomato paste. Low-sodium or reduced-sodium canned vegetables. Grains Whole-grain or whole-wheat bread. Whole-grain or whole-wheat pasta. Brown rice. [place]. Bulgur. Whole-grain and low-sodium cereals. Pita bread. Low-fat, low-sodium crackers. Whole-wheat flour tortillas. Meats and other proteins Skinless chicken or [place]. Ground chicken or [place]. Pork with fat trimmed off. Fish and seafood. Egg whites. Dried beans, peas, or lentils. Unsalted nuts, nut butters, and seeds. Unsalted canned beans. Lean cuts of beef with fat trimmed off. Low-sodium, lean precooked or cured meat, such as sausages or meat loaves. Dairy Low-fat (1%) or fat-free (skim) milk. Reduced-fat, low-fat, or fat-free cheeses. Nonfat, low-sodium ricotta or cottage cheese. Low-fat or nonfat yogurt. Low-fat, low-sodium cheese. Fats and oils Soft margarine without trans fats. Vegetable oil. Reduced-fat, low-fat, or light mayonnaise and salad dressings (reduced-sodium). Canola, safflower, olive, avocado, soybean, and sunflower oils. Avocado. Seasonings and condiments Herbs. Spices. Seasoning mixes without salt. Other foods Unsalted popcorn and pretzels. Fat-free sweets. The items listed above may not be a complete list of foods and beverages you can eat. Contact a dietitian for more information. What foods should I avoid? Fruits Canned fruit in a light or heavy syrup. Fried fruit. Fruit in cream or butter sauce. Vegetables Creamed or fried vegetables. Vegetables in a cheese sauce. Regular canned vegetables (not low-sodium or reduced-sodium). Regular canned tomato sauce and paste (not low-sodium or reduced-sodium). Regular tomato and vegetable juice (not low-sodium or reduced-sodium). Angie Fava. Olives. Grains Baked goods made with fat, such as croissants, muffins, or some breads. Dry pasta or rice meal packs. Meats and other  proteins Fatty cuts of meat. Ribs. Fried meat. Berniece Salines. Bologna, salami, and other precooked or cured meats, such as sausages or meat loaves. Fat from the back of a pig (fatback). Bratwurst. Salted nuts and seeds. Canned beans with added salt. Canned or smoked fish. Whole eggs or egg yolks. Chicken or [place] with skin. Dairy Whole or 2% milk, cream, and half-and-half. Whole or full-fat cream cheese. Whole-fat or sweetened yogurt. Full-fat cheese. Nondairy creamers. Whipped toppings. Processed cheese and cheese spreads. Fats and oils Butter. Stick margarine. Lard. Shortening. Ghee. Bacon fat. Tropical oils, such as coconut, palm kernel, or palm oil. Seasonings and condiments Onion salt, garlic salt, seasoned salt, table salt, and sea salt. Worcestershire sauce. Tartar sauce. Barbecue sauce. Teriyaki sauce. Soy sauce, including reduced-sodium. Steak sauce. Canned and packaged gravies. Fish sauce. Oyster sauce. Cocktail sauce. Store-bought horseradish. Ketchup. Mustard. Meat flavorings and tenderizers. Bouillon cubes. Hot sauces. Pre-made or packaged marinades. Pre-made or packaged taco seasonings. Relishes. Regular salad dressings. Other foods Salted popcorn and pretzels. The items listed above may not be a complete list of foods and beverages you should avoid. Contact a dietitian for more information. Where to find more information National Heart, Lung, and Blood Institute: https://wilson-eaton.com/ American Heart Association: www.heart.org Academy of Nutrition and Dietetics: www.eatright.[place]: www.kidney.org Summary The DASH eating plan is a  healthy eating plan that has been shown to reduce high blood pressure (hypertension). It may also reduce your risk for type 2 diabetes, heart disease, and stroke. When on the DASH eating plan, aim to eat more fresh fruits and vegetables, whole grains, lean proteins, low-fat dairy, and heart-healthy fats. With the DASH eating plan, you should  limit salt (sodium) intake to 2,300 mg a day. If you have hypertension, you may need to reduce your sodium intake to 1,500 mg a day. Work with your health care provider or dietitian to adjust your eating plan to your individual calorie needs. This information is not intended to replace advice given to you by your health care provider. Make sure you discuss any questions you have with your health care provider. Document Revised: 06/12/2019 Document Reviewed: 06/12/2019 Elsevier Patient Education  [place].

## 2022-07-18 NOTE — Addendum Note (Signed)
Addended by: Avanell Shackleton on: 07/18/2022 12:03 PM   Modules accepted: Orders

## 2022-07-18 NOTE — Progress Notes (Addendum)
New Patient Office Visit  Subjective    Patient ID: Robert Bruce, male    DOB: 01-09-1976  Age: 46 y.o. MRN: [id number]  CC: No chief complaint on file.   HPI Robert Bruce presents to establish care No PCP since 2021.   Hx of uncontrolled HTN for years. Has been out of HCTZ for at least 1 year.   Hx of GERD. Has been having nausea upon awakening.   States 2 wks ago he was vomiting up blood. Denies any since then. Denies blood in stool or back, tarry stools.    Drinks alcohol daily 3-4 shots per day.  Smokes but is trying to stop. Has been smoking since [age] years old.  No drug use.   Depression- mood is fairly good but he does have some bad days.  Does not want to be on the medication he was taking, citalopram.   Denies fever, chills, chest pain, palpitations, shortness of breath, diarrhea or constipation, urinary symptoms, LE edema.    Hx of gallbladder sludge on RUQ [place] in 2019  Married. No kids.  Works as Chartered loss adjuster Medications as of 07/18/2022  Medication Sig   atorvastatin (LIPITOR) 20 MG tablet Take 1 tablet (20 mg total) by mouth daily.   citalopram (CELEXA) 20 MG tablet Take 1 tablet (20 mg total) by mouth daily.   nadolol (CORGARD) 20 MG tablet Take 1 tablet (20 mg total) by mouth daily.   valsartan-hydrochlorothiazide (DIOVAN-HCT) 160-12.5 MG tablet Take 1 tablet by mouth daily.   [DISCONTINUED] atorvastatin (LIPITOR) 10 MG tablet Take 1 tablet (10 mg total) by mouth daily.   [DISCONTINUED] hydrochlorothiazide (HYDRODIURIL) 12.5 MG tablet Take 1 tablet (12.5 mg total) by mouth daily.   [DISCONTINUED] pantoprazole (PROTONIX) 40 MG tablet Take 1 tablet (40 mg total) by mouth daily.   pantoprazole (PROTONIX) 40 MG tablet Take 1 tablet (40 mg total) by mouth daily.   No facility-administered encounter medications on file as of 07/18/2022.    Past Medical History:  Diagnosis Date   Asthma    Hypertension     Past  Surgical History:  Procedure Laterality Date   lymph node removal      Family History  Problem Relation Age of Onset   Hyperlipidemia Mother    Hypertension Brother    Kidney disease Maternal Grandmother    Colon cancer Neg Hx    Esophageal cancer Neg Hx     Social History   Socioeconomic History   Marital status: Married    Spouse name: Not on file   Number of children: Not on file   Years of education: Not on file   Highest education level: Not on file  Occupational History   Not on file  Tobacco Use   Smoking status: Some Days    Packs/day: 0.25    Types: Cigarettes   Smokeless tobacco: Never  Vaping Use   Vaping Use: Never used  Substance and Sexual Activity   Alcohol use: Yes    Comment: most days   Drug use: No   Sexual activity: Not on file  Other Topics Concern   Not on file  Social History Narrative   Not on file   Social Determinants of Health   Financial Resource Strain: Not on file  Food Insecurity: Not on file  Transportation Needs: Not on file  Physical Activity: Not on file  Stress: Not on file  Social Connections: Not on file  Intimate Partner Violence:  Not on file    ROS      Objective    BP (!) 140/100 (BP Location: Right Arm, Patient Position: Sitting, Cuff Size: Normal)   Pulse 97   Temp 98.3 F (36.8 C) (Oral)   Ht 6\' 1"  (1.854 m)   Wt 194 lb (88 kg)   SpO2 97%   BMI 25.60 kg/m   Physical Exam Constitutional:      General: He is not in acute distress.    Appearance: He is not ill-appearing.  HENT:     Mouth/Throat:     Mouth: Mucous membranes are moist.  Eyes:     Extraocular Movements: Extraocular movements intact.     Pupils: Pupils are equal, round, and reactive to light.  Cardiovascular:     Rate and Rhythm: Normal rate and regular rhythm.  Pulmonary:     Effort: Pulmonary effort is normal.     Breath sounds: Normal breath sounds.  Abdominal:     General: There is no distension.     Palpations: Abdomen is  soft.     Tenderness: There is no abdominal tenderness. There is no right CVA tenderness, left CVA tenderness, guarding or rebound.  Musculoskeletal:     Cervical back: Normal range of motion and neck supple.     Right lower leg: No edema.     Left lower leg: No edema.  Skin:    General: Skin is warm and dry.  Neurological:     General: No focal deficit present.     Mental Status: He is alert and oriented to person, place, and time.     Cranial Nerves: No cranial nerve deficit.     Motor: No weakness.     Gait: Gait normal.  Psychiatric:        Mood and Affect: Mood normal.        Behavior: Behavior normal.        Thought Content: Thought content normal.         Assessment & Plan:   Problem List Items Addressed This Visit       Cardiovascular and Mediastinum   Uncontrolled hypertension   Relevant Medications   nadolol (CORGARD) 20 MG tablet   valsartan-hydrochlorothiazide (DIOVAN-HCT) 160-12.5 MG tablet   atorvastatin (LIPITOR) 20 MG tablet   Other Relevant Orders   Basic metabolic panel (Completed)   CBC with Differential/Platelet (Completed)   TSH (Completed)   T4, free (Completed)   Urinalysis, Routine w reflex microscopic (Completed)   Aldosterone + renin activity w/ ratio   EKG 12-Lead   DG Chest 2 View (Completed)     Digestive   Hematemesis with nausea - Primary   Relevant Medications   pantoprazole (PROTONIX) 40 MG tablet   Other Relevant Orders   Basic metabolic panel (Completed)   CBC with Differential/Platelet (Completed)   Lipid panel (Completed)   Hepatic function panel (Completed)   Ambulatory referral to Gastroenterology   Protime-INR (Completed)     Other   Daily consumption of alcohol   Relevant Orders   Protime-INR (Completed)   EKG abnormalities   Hyperlipidemia   Relevant Medications   nadolol (CORGARD) 20 MG tablet   valsartan-hydrochlorothiazide (DIOVAN-HCT) 160-12.5 MG tablet   atorvastatin (LIPITOR) 20 MG tablet   Other  Relevant Orders   Lipid panel (Completed)   Moderate major depression (HCC)   Nausea   Relevant Medications   pantoprazole (PROTONIX) 40 MG tablet   Other Relevant Orders   Ambulatory referral to Gastroenterology  Protime-INR (Completed)   Recurrent epigastric abdominal pain   Relevant Medications   pantoprazole (PROTONIX) 40 MG tablet   Other Relevant Orders   Ambulatory referral to Gastroenterology   Smoker   Other Visit Diagnoses     Hypercholesterolemia with LDL greater than 190 mg/dL       Relevant Medications   nadolol (CORGARD) 20 MG tablet   valsartan-hydrochlorothiazide (DIOVAN-HCT) 160-12.5 MG tablet   atorvastatin (LIPITOR) 20 MG tablet      EKG shows NSR with rate 84. QT prolongation and nonspecific T wave abnormality. No ST elevation or Q waves. Read by myself and Dr. Yetta Barre.   He is not in acute distress. Abdominal exam is benign.  Possible stomach ulcer vs varices based on alcohol hx and recent episode of hematemesis. Liver function and BUN normal today. No anemia.  Normal PT/INR. No electrolyte derangement.  Chest X ray negative.  Start Protonix and refer to GI.  HTN- check labs to look for underlying etiology.  Start on valsartan-HCTZ 160-12.5mg  Start on Corgard 20 mg daily.  Check BP at home.  DASH diet handout provided.  Recommend cutting back on alcohol use.  Continue smoking cessation.   Discussed depression further and consider restarting statin at follow up visit.  Denies SI. Consider therapist, he has seen one in the past.   Follow up here in 4 weeks or sooner if any concerns.   Return in about 4 weeks (around 08/15/2022) for HTN.   Hetty Blend, NP-C

## 2022-07-22 LAB — ALDOSTERONE + RENIN ACTIVITY W/ RATIO
ALDO / PRA Ratio: 1 Ratio (ref 0.9–28.9)
Aldosterone: 5 ng/dL
Renin Activity: 4.96 ng/mL/h (ref 0.25–5.82)

## 2022-08-15 ENCOUNTER — Encounter: Payer: Self-pay | Admitting: Family Medicine

## 2022-08-15 ENCOUNTER — Ambulatory Visit (INDEPENDENT_AMBULATORY_CARE_PROVIDER_SITE_OTHER): Payer: 59 | Admitting: Family Medicine

## 2022-08-15 VITALS — BP 148/96 | HR 65 | Temp 97.6°F | Ht 73.0 in | Wt 191.0 lb

## 2022-08-15 DIAGNOSIS — I10 Essential (primary) hypertension: Secondary | ICD-10-CM

## 2022-08-15 DIAGNOSIS — E7800 Pure hypercholesterolemia, unspecified: Secondary | ICD-10-CM

## 2022-08-15 NOTE — Patient Instructions (Addendum)
Please check with your pharmacy.   Acid reflux- 90 tablets of pantoprazole (Protonix) 40 mg Blood pressure- 90 tablets of nadolol (Corgard) 20 mg and 90 tablets of valsartan-HCTZ  Cholesterol - you should have 90 days of atorvastatin (Lipitor) 20 mg   Take these as prescribed and follow up in 6-8 weeks fasting (nothing to eat or drink except water for at least 6 hours).   Please call [facility] to schedule your visit -  [phone] option 1      High Cholesterol  High cholesterol is a condition in which the blood has high levels of a white, waxy substance similar to fat (cholesterol). The liver makes all the cholesterol that the body needs. The human body needs small amounts of cholesterol to help build cells. A person gets extra or excess cholesterol from the food that he or she eats. The blood carries cholesterol from the liver to the rest of the body. If you have high cholesterol, deposits (plaques) may build up on the walls of your arteries. Arteries are the blood vessels that carry blood away from your heart. These plaques make the arteries narrow and stiff. Cholesterol plaques increase your risk for heart attack and stroke. Work with your health care provider to keep your cholesterol levels in a healthy range. What increases the risk? The following factors may make you more likely to develop this condition: Eating foods that are high in animal fat (saturated fat) or cholesterol. Being overweight. Not getting enough exercise. A family history of high cholesterol (familial hypercholesterolemia). Use of tobacco products. Having diabetes. What are the signs or symptoms? In most cases, high cholesterol does not usually cause any symptoms. In severe cases, very high cholesterol levels can cause: Fatty bumps under the skin (xanthomas). A white or gray ring around the black center (pupil) of the eye. How is this diagnosed? This condition may be diagnosed based on the results of a  blood test. If you are older than 46 years of age, your health care provider may check your cholesterol levels every 4-6 years. You may be checked more often if you have high cholesterol or other risk factors for heart disease. The blood test for cholesterol measures: "Bad" cholesterol, or LDL cholesterol. This is the main type of cholesterol that causes heart disease. The desired level is less than 100 mg/dL (0.27 mmol/L). "Good" cholesterol, or HDL cholesterol. HDL helps protect against heart disease by cleaning the arteries and carrying the LDL to the liver for processing. The desired level for HDL is 60 mg/dL (2.53 mmol/L) or higher. Triglycerides. These are fats that your body can store or burn for energy. The desired level is less than 150 mg/dL (6.64 mmol/L). Total cholesterol. This measures the total amount of cholesterol in your blood and includes LDL, HDL, and triglycerides. The desired level is less than 200 mg/dL (4.03 mmol/L). How is this treated? Treatment for high cholesterol starts with lifestyle changes, such as diet and exercise. Diet changes. You may be asked to eat foods that have more fiber and less saturated fats or added sugar. Lifestyle changes. These may include regular exercise, maintaining a healthy weight, and quitting use of tobacco products. Medicines. These are given when diet and lifestyle changes have not worked. You may be prescribed a statin medicine to help lower your cholesterol levels. Follow these instructions at home: Eating and drinking  Eat a healthy, balanced diet. This diet includes: Daily servings of a variety of fresh, frozen, or canned fruits and  vegetables. Daily servings of whole grain foods that are rich in fiber. Foods that are low in saturated fats and trans fats. These include poultry and fish without skin, lean cuts of meat, and low-fat dairy products. A variety of fish, especially oily fish that contain omega-3 fatty acids. Aim to eat fish at  least 2 times a week. Avoid foods and drinks that have added sugar. Use healthy cooking methods, such as roasting, grilling, broiling, baking, poaching, steaming, and stir-frying. Do not fry your food except for stir-frying. If you drink alcohol: Limit how much you have to: 0-1 drink a day for women who are not pregnant. 0-2 drinks a day for men. Know how much alcohol is in a drink. In the [place] one drink equals one 12 oz bottle of beer (355 mL), one 5 oz glass of wine (148 mL), or one 1 oz glass of hard liquor (44 mL). Lifestyle  Get regular exercise. Aim to exercise for a total of 150 minutes a week. Increase your activity level by doing activities such as gardening, walking, and taking the stairs. Do not use any products that contain nicotine or tobacco. These products include cigarettes, chewing tobacco, and vaping devices, such as e-cigarettes. If you need help quitting, ask your health care provider. General instructions Take over-the-counter and prescription medicines only as told by your health care provider. Keep all follow-up visits. This is important. Where to find more information American Heart Association: www.heart.org National Heart, Lung, and Blood Institute: https://wilson-eaton.com/ Contact a health care provider if: You have trouble achieving or maintaining a healthy diet or weight. You are starting an exercise program. You are unable to stop smoking. Get help right away if: You have chest pain. You have trouble breathing. You have discomfort or pain in your jaw, neck, back, shoulder, or arm. You have any symptoms of a stroke. "BE FAST" is an easy way to remember the main warning signs of a stroke: B - Balance. Signs are dizziness, sudden trouble walking, or loss of balance. E - Eyes. Signs are trouble seeing or a sudden change in vision. F - Face. Signs are sudden weakness or numbness of the face, or the face or eyelid drooping on one side. A - Arms. Signs are weakness or  numbness in an arm. This happens suddenly and usually on one side of the body. S - Speech. Signs are sudden trouble speaking, slurred speech, or trouble understanding what people say. T - Time. Time to call emergency services. Write down what time symptoms started. You have other signs of a stroke, such as: A sudden, severe headache with no known cause. Nausea or vomiting. Seizure. These symptoms may represent a serious problem that is an emergency. Do not wait to see if the symptoms will go away. Get medical help right away. Call your local emergency services (911 in the [place] Do not drive yourself to the hospital. Summary Cholesterol plaques increase your risk for heart attack and stroke. Work with your health care provider to keep your cholesterol levels in a healthy range. Eat a healthy, balanced diet, get regular exercise, and maintain a healthy weight. Do not use any products that contain nicotine or tobacco. These products include cigarettes, chewing tobacco, and vaping devices, such as e-cigarettes. Get help right away if you have any symptoms of a stroke. This information is not intended to replace advice given to you by your health care provider. Make sure you discuss any questions you have with your health care provider.  Document Revised: 02/09/2022 Document Reviewed: 09/12/2020 Elsevier Patient Education  [place].

## 2022-08-15 NOTE — Progress Notes (Signed)
Subjective:     Patient ID: Robert Bruce, male    DOB: 01-Aug-1975, 47 y.o.   MRN: [id number]  Chief Complaint  Patient presents with   Hypertension    1 week f/u, was not able to check BP at home.    HPI Patient is in today for a 4 wk f/u on HTN, HL, and chronic abdominal pain/nausea.   States he is only taking 2 medications and he is unsure which ones he is taking.   Has not been checking BP at home.   [facility] called him to schedule. He is unsure if he started the Protonix.   Denies any more hematemesis.   No new concerns today.   Health Maintenance Due  Topic Date Due   HIV Screening  Never done   Hepatitis C Screening  Never done   DTaP/Tdap/Td (1 - Tdap) Never done    Past Medical History:  Diagnosis Date   Asthma    Hypertension     Past Surgical History:  Procedure Laterality Date   lymph node removal      Family History  Problem Relation Age of Onset   Hyperlipidemia Mother    Hypertension Brother    Kidney disease Maternal Grandmother    Colon cancer Neg Hx    Esophageal cancer Neg Hx     Social History   Socioeconomic History   Marital status: Married    Spouse name: Not on file   Number of children: Not on file   Years of education: Not on file   Highest education level: Not on file  Occupational History   Not on file  Tobacco Use   Smoking status: Some Days    Packs/day: 0.25    Types: Cigarettes   Smokeless tobacco: Never  Vaping Use   Vaping Use: Never used  Substance and Sexual Activity   Alcohol use: Yes    Comment: most days   Drug use: No   Sexual activity: Not on file  Other Topics Concern   Not on file  Social History Narrative   Not on file   Social Determinants of Health   Financial Resource Strain: Not on file  Food Insecurity: Not on file  Transportation Needs: Not on file  Physical Activity: Not on file  Stress: Not on file  Social Connections: Not on file  Intimate Partner Violence: Not on file     Outpatient Medications Prior to Visit  Medication Sig Dispense Refill   atorvastatin (LIPITOR) 20 MG tablet Take 1 tablet (20 mg total) by mouth daily. 90 tablet 1   citalopram (CELEXA) 20 MG tablet Take 1 tablet (20 mg total) by mouth daily. 90 tablet 3   nadolol (CORGARD) 20 MG tablet Take 1 tablet (20 mg total) by mouth daily. 90 tablet 0   pantoprazole (PROTONIX) 40 MG tablet Take 1 tablet (40 mg total) by mouth daily. 90 tablet 0   valsartan-hydrochlorothiazide (DIOVAN-HCT) 160-12.5 MG tablet Take 1 tablet by mouth daily. 90 tablet 0   No facility-administered medications prior to visit.    No Known Allergies  ROS     Objective:    Physical Exam  BP (!) 148/96 (BP Location: Left Arm, Patient Position: Sitting, Cuff Size: Large)   Pulse 65   Temp 97.6 F (36.4 C) (Temporal)   Ht 6\' 1"  (1.854 m)   Wt 191 lb (86.6 kg)   SpO2 100%   BMI 25.20 kg/m  Wt Readings from Last 3  Encounters:  08/15/22 191 lb (86.6 kg)  07/18/22 194 lb (88 kg)  12/29/21 190 lb (86.2 kg)   Alert and oriented and in no acute distress. Respirations unlabored.     Assessment & Plan:   Problem List Items Addressed This Visit       Cardiovascular and Mediastinum   Uncontrolled hypertension - Primary     Other   Hypercholesterolemia with LDL greater than 190 mg/dL   Unsure which medications he is taking. He will go to the pharmacy to clear this up and make sure he is on prescribed medications to lower BP, cholesterol and for acid reflux. Counseling on LDL >190 and potential long term health consequences such as heart disease, MI, stroke, etc.  Counseling on diet and exercise.  Provided him with [facility] phone number and encouraged him to call them back to schedule.  Follow up here fasting after he has been taking his statin for at least 6 weeks.   I am having Robert Bruce maintain his citalopram, pantoprazole, nadolol, valsartan-hydrochlorothiazide, and atorvastatin.  No orders of  the defined types were placed in this encounter.

## 2022-09-26 ENCOUNTER — Encounter: Payer: Self-pay | Admitting: Family Medicine

## 2022-09-26 ENCOUNTER — Ambulatory Visit (INDEPENDENT_AMBULATORY_CARE_PROVIDER_SITE_OTHER): Payer: 59 | Admitting: Family Medicine

## 2022-09-26 VITALS — BP 132/80 | HR 60 | Temp 97.6°F | Ht 73.0 in | Wt 181.0 lb

## 2022-09-26 DIAGNOSIS — R634 Abnormal weight loss: Secondary | ICD-10-CM | POA: Diagnosis not present

## 2022-09-26 DIAGNOSIS — E639 Nutritional deficiency, unspecified: Secondary | ICD-10-CM | POA: Insufficient documentation

## 2022-09-26 DIAGNOSIS — Z789 Other specified health status: Secondary | ICD-10-CM

## 2022-09-26 DIAGNOSIS — I10 Essential (primary) hypertension: Secondary | ICD-10-CM

## 2022-09-26 DIAGNOSIS — Z125 Encounter for screening for malignant neoplasm of prostate: Secondary | ICD-10-CM

## 2022-09-26 DIAGNOSIS — R1013 Epigastric pain: Secondary | ICD-10-CM

## 2022-09-26 DIAGNOSIS — Z1211 Encounter for screening for malignant neoplasm of colon: Secondary | ICD-10-CM | POA: Insufficient documentation

## 2022-09-26 DIAGNOSIS — E7800 Pure hypercholesterolemia, unspecified: Secondary | ICD-10-CM | POA: Diagnosis not present

## 2022-09-26 DIAGNOSIS — Z1159 Encounter for screening for other viral diseases: Secondary | ICD-10-CM

## 2022-09-26 LAB — CBC WITH DIFFERENTIAL/PLATELET
Basophils Absolute: 0 10*3/uL (ref 0.0–0.1)
Basophils Relative: 0.5 % (ref 0.0–3.0)
Eosinophils Absolute: 0.1 10*3/uL (ref 0.0–0.7)
Eosinophils Relative: 2.6 % (ref 0.0–5.0)
HCT: 40.5 % (ref 39.0–52.0)
Hemoglobin: 13.6 g/dL (ref 13.0–17.0)
Lymphocytes Relative: 45.5 % (ref 12.0–46.0)
Lymphs Abs: 2.6 10*3/uL (ref 0.7–4.0)
MCHC: 33.6 g/dL (ref 30.0–36.0)
MCV: 90.6 fl (ref 78.0–100.0)
Monocytes Absolute: 0.6 10*3/uL (ref 0.1–1.0)
Monocytes Relative: 10.1 % (ref 3.0–12.0)
Neutro Abs: 2.3 10*3/uL (ref 1.4–7.7)
Neutrophils Relative %: 41.3 % — ABNORMAL LOW (ref 43.0–77.0)
Platelets: 251 10*3/uL (ref 150.0–400.0)
RBC: 4.47 Mil/uL (ref 4.22–5.81)
RDW: 13.9 % (ref 11.5–15.5)
WBC: 5.7 10*3/uL (ref 4.0–10.5)

## 2022-09-26 LAB — BASIC METABOLIC PANEL
BUN: 16 mg/dL (ref 6–23)
CO2: 29 mEq/L (ref 19–32)
Calcium: 10.2 mg/dL (ref 8.4–10.5)
Chloride: 99 mEq/L (ref 96–112)
Creatinine, Ser: 0.99 mg/dL (ref 0.40–1.50)
GFR: 91.87 mL/min (ref >60.00)
Glucose, Bld: 81 mg/dL (ref 70–99)
Potassium: 4 mEq/L (ref 3.5–5.1)
Sodium: 140 mEq/L (ref 135–145)

## 2022-09-26 LAB — HEPATIC FUNCTION PANEL
ALT: 25 U/L (ref 0–53)
AST: 28 U/L (ref 0–37)
Albumin: 4.8 g/dL (ref 3.5–5.2)
Alkaline Phosphatase: 51 U/L (ref 39–117)
Bilirubin, Direct: 0.1 mg/dL (ref 0.0–0.3)
Total Bilirubin: 0.6 mg/dL (ref 0.2–1.2)
Total Protein: 7.8 g/dL (ref 6.0–8.3)

## 2022-09-26 LAB — PSA: PSA: 0.12 ng/mL (ref 0.10–4.00)

## 2022-09-26 LAB — FOLATE: Folate: 8.6 ng/mL (ref >5.9)

## 2022-09-26 LAB — LDL CHOLESTEROL, DIRECT: Direct LDL: 98 mg/dL

## 2022-09-26 LAB — MAGNESIUM: Magnesium: 2 mg/dL (ref 1.5–2.5)

## 2022-09-26 LAB — VITAMIN B12: Vitamin B-12: 170 pg/mL — ABNORMAL LOW (ref 211–911)

## 2022-09-26 LAB — LIPID PANEL
Cholesterol: 195 mg/dL (ref 0–200)
HDL: 44 mg/dL (ref >39.00)
NonHDL: 151.07
Total CHOL/HDL Ratio: 4
Triglycerides: 366 mg/dL — ABNORMAL HIGH (ref 0.0–149.0)
VLDL: 73.2 mg/dL — ABNORMAL HIGH (ref 0.0–40.0)

## 2022-09-26 MED ORDER — VALSARTAN-HYDROCHLOROTHIAZIDE 160-12.5 MG PO TABS: 1.0000 | ORAL_TABLET | Freq: Every day | ORAL | 1 refills | Status: DC

## 2022-09-26 NOTE — Addendum Note (Signed)
Addended by: Rossie Muskrat on: 09/26/2022 09:40 AM   Modules accepted: Orders

## 2022-09-26 NOTE — Assessment & Plan Note (Signed)
He is requesting help to stop drinking alcohol.  States he is not able to do inpatient therapy at this time but is interested in outpatient.  I provided him with names of facilities that can help him including the [facility].  Encouraged him to call.

## 2022-09-26 NOTE — Progress Notes (Signed)
Subjective:     Patient ID: Robert Bruce, male    DOB: July 18, 1976, 47 y.o.   MRN: [id number]  Chief Complaint  Patient presents with   Hyperlipidemia    Recheck labs, fasting    Hyperlipidemia   Patient is in today for follow up on uncontrolled HTN, HLD, chronic abdominal pain with nausea and vomiting.   States he has been taking all of his medications daily.   HTN-valsartan-HCTZ 160-12.5 mg daily and nadolol 20 mg daily.  HDL with LDL greater than 190 -taking atorvastatin 20 mg daily.  States he is losing weight. He is concerned about this. States his diet is not good. He is not eating more than twice daily.   Drinks alcohol daily. Drinks 2-3 drinks to help him sleep at night. He would like help to stop drinking completely.  Denies getting help in the past with alcohol detox. States he had a therapist when he lived in [place].  Protonix has helped with abdominal pain  He did not schedule with GI since his abdominal pain improved. No more hematemesis. He still has intermittent epigastric pain and nausea.  Denies fever, chills, dizziness, chest pain, palpitations, shortness of breath, cough, urinary symptoms.    Health Maintenance Due  Topic Date Due   HIV Screening  Never done   Hepatitis C Screening  Never done   DTaP/Tdap/Td (1 - Tdap) Never done    Past Medical History:  Diagnosis Date   Asthma    Hypertension     Past Surgical History:  Procedure Laterality Date   lymph node removal      Family History  Problem Relation Age of Onset   Hyperlipidemia Mother    Hypertension Brother    Kidney disease Maternal Grandmother    Colon cancer Neg Hx    Esophageal cancer Neg Hx     Social History   Socioeconomic History   Marital status: Married    Spouse name: Not on file   Number of children: Not on file   Years of education: Not on file   Highest education level: Not on file  Occupational History   Not on file  Tobacco Use   Smoking  status: Some Days    Packs/day: 0.25    Types: Cigarettes   Smokeless tobacco: Never  Vaping Use   Vaping Use: Never used  Substance and Sexual Activity   Alcohol use: Yes    Comment: most days   Drug use: No   Sexual activity: Not on file  Other Topics Concern   Not on file  Social History Narrative   Not on file   Social Determinants of Health   Financial Resource Strain: Not on file  Food Insecurity: Not on file  Transportation Needs: Not on file  Physical Activity: Not on file  Stress: Not on file  Social Connections: Not on file  Intimate Partner Violence: Not on file    Outpatient Medications Prior to Visit  Medication Sig Dispense Refill   atorvastatin (LIPITOR) 20 MG tablet Take 1 tablet (20 mg total) by mouth daily. 90 tablet 1   nadolol (CORGARD) 20 MG tablet Take 1 tablet (20 mg total) by mouth daily. 90 tablet 0   pantoprazole (PROTONIX) 40 MG tablet Take 1 tablet (40 mg total) by mouth daily. 90 tablet 0   citalopram (CELEXA) 20 MG tablet Take 1 tablet (20 mg total) by mouth daily. 90 tablet 3   valsartan-hydrochlorothiazide (DIOVAN-HCT) 160-12.5 MG tablet Take 1  tablet by mouth daily. 90 tablet 0   No facility-administered medications prior to visit.    No Known Allergies  ROS     Objective:    Physical Exam Constitutional:      General: He is not in acute distress.    Appearance: He is not ill-appearing.  Eyes:     Conjunctiva/sclera: Conjunctivae normal.  Cardiovascular:     Rate and Rhythm: Normal rate.  Pulmonary:     Effort: Pulmonary effort is normal.  Skin:    General: Skin is warm and dry.  Neurological:     General: No focal deficit present.     Mental Status: He is alert and oriented to person, place, and time.  Psychiatric:        Attention and Perception: Attention normal.        Mood and Affect: Mood normal.        Speech: Speech normal.        Behavior: Behavior normal.        Thought Content: Thought content normal. Thought  content is not paranoid. Thought content does not include homicidal or suicidal ideation.     BP 132/80 (BP Location: Left Arm, Patient Position: Sitting, Cuff Size: Large)   Pulse 60   Temp 97.6 F (36.4 C) (Temporal)   Ht '6\' 1"'$  (1.854 m)   Wt 181 lb (82.1 kg)   SpO2 98%   BMI 23.88 kg/m  Wt Readings from Last 3 Encounters:  09/26/22 181 lb (82.1 kg)  08/15/22 191 lb (86.6 kg)  07/18/22 194 lb (88 kg)       Assessment & Plan:   Problem List Items Addressed This Visit       Cardiovascular and Mediastinum   Uncontrolled hypertension - Primary    Blood pressure now well-controlled.  Continue medication regimen.  Continue low-sodium diet.  Encouraged alcohol cessation which will also help with his blood pressure.      Relevant Medications   valsartan-hydrochlorothiazide (DIOVAN-HCT) 160-12.5 MG tablet   Other Relevant Orders   Basic metabolic panel     Other   Daily consumption of alcohol    He is requesting help to stop drinking alcohol.  States he is not able to do inpatient therapy at this time but is interested in outpatient.  I provided him with names of facilities that can help him including the [facility].  Encouraged him to call.       Relevant Orders   Hepatic function panel   Vitamin B1   Hypercholesterolemia with LDL greater than 190 mg/dL    Check lipid panel.  Continue statin therapy.      Relevant Medications   valsartan-hydrochlorothiazide (DIOVAN-HCT) 160-12.5 MG tablet   Other Relevant Orders   Lipid panel   Poor diet    Check labs.  Daily alcohol use is most likely interfering with his diet.  Encouraged healthier choices with diet and staying hydrated.      Relevant Orders   Vitamin B12   Folate   Magnesium   Recurrent epigastric abdominal pain    Improved since being on Protonix.  He was referred to GI but declined to schedule since symptoms have improved.      Relevant Orders   Ambulatory referral to  Gastroenterology   Screen for colon cancer    This will be his first colon cancer screening test.  He is willing to schedule.      Relevant Orders  Ambulatory referral to Gastroenterology   Weight loss, unintentional    10 lb weight loss since January 2024. Most likely due to daily alcohol use and poor diet but cannot rule out underlying malignant etiology.  Referral to GI for colon cancer screening. PSA ordered.       Relevant Orders   CBC with Differential/Platelet   Basic metabolic panel   Hepatic function panel   Ambulatory referral to Gastroenterology   HIV Antibody (routine testing w rflx)   PSA   Other Visit Diagnoses     Need for hepatitis C screening test       Relevant Orders   Hepatitis C antibody   Screening for prostate cancer       Relevant Orders   PSA       I have discontinued Josealfredo L. Pai's citalopram. I am also having him maintain his pantoprazole, nadolol, atorvastatin, and valsartan-hydrochlorothiazide.  Meds ordered this encounter  Medications   valsartan-hydrochlorothiazide (DIOVAN-HCT) 160-12.5 MG tablet    Sig: Take 1 tablet by mouth daily.    Dispense:  90 tablet    Refill:  1

## 2022-09-26 NOTE — Assessment & Plan Note (Signed)
This will be his first colon cancer screening test.  He is willing to schedule.

## 2022-09-26 NOTE — Assessment & Plan Note (Signed)
Check labs.  Daily alcohol use is most likely interfering with his diet.  Encouraged healthier choices with diet and staying hydrated.

## 2022-09-26 NOTE — Progress Notes (Signed)
His B12 level is very low. Please call and schedule him for B12 injections weekly for the next 4 weeks. After those are finished, he can start taking OTC vitamin B12 1,000 mcg daily.

## 2022-09-26 NOTE — Assessment & Plan Note (Signed)
Blood pressure now well-controlled.  Continue medication regimen.  Continue low-sodium diet.  Encouraged alcohol cessation which will also help with his blood pressure.

## 2022-09-26 NOTE — Assessment & Plan Note (Addendum)
Improved since being on Protonix.  He was referred to GI but declined to schedule since symptoms have improved.

## 2022-09-26 NOTE — Patient Instructions (Addendum)
Please go downstairs for labs before you leave today.  Continue your current medications.  [facility] office will call you to schedule a visit. It is time for your colon cancer screening.   Please call and schedule with one of the following places for alcohol treatment.  [facility] (Outpatient)  [place], [place] [address] Phone: [phone]  [facility]  [address] [place], [place] [address] Phone: [phone]

## 2022-09-26 NOTE — Assessment & Plan Note (Signed)
Check lipid panel.  Continue statin therapy.

## 2022-09-26 NOTE — Assessment & Plan Note (Addendum)
10 lb weight loss since January 2024. Most likely due to daily alcohol use and poor diet but cannot rule out underlying malignant etiology.  Referral to GI for colon cancer screening. PSA ordered.

## 2022-09-30 ENCOUNTER — Other Ambulatory Visit: Payer: Self-pay | Admitting: Family Medicine

## 2022-09-30 DIAGNOSIS — E519 Thiamine deficiency, unspecified: Secondary | ICD-10-CM

## 2022-09-30 LAB — HIV ANTIBODY (ROUTINE TESTING W REFLEX): HIV 1&2 Ab, 4th Generation: NONREACTIVE

## 2022-09-30 LAB — HEPATITIS C ANTIBODY: Hepatitis C Ab: NONREACTIVE

## 2022-09-30 LAB — VITAMIN B1: Vitamin B1 (Thiamine): <6 nmol/L — ABNORMAL LOW (ref 8–30)

## 2022-09-30 MED ORDER — THIAMINE HCL 100 MG PO TABS: 100.0000 mg | ORAL_TABLET | Freq: Every day | ORAL | 0 refills | Status: AC

## 2022-09-30 NOTE — Progress Notes (Signed)
Please let him know the following:  Your thiamine level is low. This is due to regular alcohol use. It can cause you to feel tired, have trouble concentrating and affect your appetite. Please start the prescribed supplement I sent to your pharmacy. We will recheck this at your follow up  Also, is he coming in for B12 injections?

## 2022-10-01 ENCOUNTER — Ambulatory Visit (INDEPENDENT_AMBULATORY_CARE_PROVIDER_SITE_OTHER): Payer: 59 | Admitting: *Deleted

## 2022-10-01 ENCOUNTER — Ambulatory Visit: Payer: 59

## 2022-10-01 DIAGNOSIS — E538 Deficiency of other specified B group vitamins: Secondary | ICD-10-CM

## 2022-10-01 MED ORDER — CYANOCOBALAMIN 1000 MCG/ML IJ SOLN
1000.0000 ug | Freq: Once | INTRAMUSCULAR | Status: AC
  Administered 2022-10-01: 1000 ug via INTRAMUSCULAR

## 2022-10-01 NOTE — Progress Notes (Signed)
Pls cosign for B12 inj in absence of PCP../lmb   

## 2022-10-08 ENCOUNTER — Ambulatory Visit (INDEPENDENT_AMBULATORY_CARE_PROVIDER_SITE_OTHER): Payer: 59

## 2022-10-08 DIAGNOSIS — E538 Deficiency of other specified B group vitamins: Secondary | ICD-10-CM | POA: Diagnosis not present

## 2022-10-08 MED ORDER — CYANOCOBALAMIN 1000 MCG/ML IJ SOLN
1000.0000 ug | Freq: Once | INTRAMUSCULAR | Status: AC
  Administered 2022-10-08: 1000 ug via INTRAMUSCULAR

## 2022-10-08 NOTE — Progress Notes (Signed)
After obtaining consent, and per orders of [facility], injection of B12 given by Marrian Salvage. Patient instructed to report any adverse reaction to me immediately.

## 2022-10-10 ENCOUNTER — Other Ambulatory Visit: Payer: Self-pay | Admitting: Family Medicine

## 2022-10-15 ENCOUNTER — Ambulatory Visit (INDEPENDENT_AMBULATORY_CARE_PROVIDER_SITE_OTHER): Payer: 59 | Admitting: Radiology

## 2022-10-15 DIAGNOSIS — E538 Deficiency of other specified B group vitamins: Secondary | ICD-10-CM

## 2022-10-15 MED ORDER — CYANOCOBALAMIN 1000 MCG/ML IJ SOLN
1000.0000 ug | Freq: Once | INTRAMUSCULAR | Status: AC
  Administered 2022-10-15: 1000 ug via INTRAMUSCULAR

## 2022-10-15 NOTE — Progress Notes (Signed)
Patient here for b12 injection and tolerated well

## 2022-10-22 ENCOUNTER — Ambulatory Visit (INDEPENDENT_AMBULATORY_CARE_PROVIDER_SITE_OTHER): Payer: 59 | Admitting: *Deleted

## 2022-10-22 DIAGNOSIS — E538 Deficiency of other specified B group vitamins: Secondary | ICD-10-CM | POA: Diagnosis not present

## 2022-10-22 MED ORDER — CYANOCOBALAMIN 1000 MCG/ML IJ SOLN
1000.0000 ug | Freq: Once | INTRAMUSCULAR | Status: AC
  Administered 2022-10-22: 1000 ug via INTRAMUSCULAR

## 2022-10-22 NOTE — Progress Notes (Addendum)
Pls cosign for B12 inj in absence of PCP.Marland KitchenJohny Bruce  Medical screening examination/treatment/procedure(s) were performed by non-physician practitioner and as supervising physician I was immediately available for consultation/collaboration.  I agree with above. Lew Dawes, MD

## 2022-11-07 ENCOUNTER — Ambulatory Visit (INDEPENDENT_AMBULATORY_CARE_PROVIDER_SITE_OTHER): Payer: 59 | Admitting: Gastroenterology

## 2022-11-07 ENCOUNTER — Encounter: Payer: Self-pay | Admitting: Gastroenterology

## 2022-11-07 VITALS — BP 120/80 | HR 64 | Ht 72.0 in | Wt 187.0 lb

## 2022-11-07 DIAGNOSIS — K219 Gastro-esophageal reflux disease without esophagitis: Secondary | ICD-10-CM | POA: Diagnosis not present

## 2022-11-07 DIAGNOSIS — Z1211 Encounter for screening for malignant neoplasm of colon: Secondary | ICD-10-CM

## 2022-11-07 DIAGNOSIS — R634 Abnormal weight loss: Secondary | ICD-10-CM | POA: Diagnosis not present

## 2022-11-07 DIAGNOSIS — Z1212 Encounter for screening for malignant neoplasm of rectum: Secondary | ICD-10-CM

## 2022-11-07 MED ORDER — PANTOPRAZOLE SODIUM 40 MG PO TBEC: 40.0000 mg | DELAYED_RELEASE_TABLET | Freq: Every day | ORAL | 4 refills | Status: DC

## 2022-11-07 NOTE — Patient Instructions (Addendum)
_______________________________________________________  If your blood pressure at your visit was 140/90 or greater, please contact your primary care physician to follow up on this.  _______________________________________________________  If you are age 46 or older, your body mass index should be between 23-30. Your Body mass index is 25.36 kg/m. If this is out of the aforementioned range listed, please consider follow up with your Primary Care Provider.  If you are age 21 or younger, your body mass index should be between 19-25. Your Body mass index is 25.36 kg/m. If this is out of the aformentioned range listed, please consider follow up with your Primary Care Provider.   ________________________________________________________  The  GI providers would like to encourage you to use [facility] to communicate with providers for non-urgent requests or questions.  Due to long hold times on the telephone, sending your provider a message by [facility] may be a faster and more efficient way to get a response.  Please allow 48 business hours for a response.  Please remember that this is for non-urgent requests.  _______________________________________________________  We have sent the following medications to your pharmacy for you to pick up at your convenience: Protonix  You have been scheduled for a colonoscopy. Please follow written instructions given to you at your visit today.  Please pick up your prep supplies at the pharmacy within the next 1-3 days. If you use inhalers (even only as needed), please bring them with you on the day of your procedure.  Record weight weekly  Cut down on the alcohol and try to stop if possible  Please call with any questions or concerns.  Thank you,  Dr. Lynann Bologna

## 2022-11-07 NOTE — Progress Notes (Signed)
Chief Complaint: For colonoscopy  Referring Provider:  Avanell Shackleton, NP-C      ASSESSMENT AND PLAN;   #1. CRC screening #2. Wt loss (resolved) #3. GERD  Plan: -Continue Protonix 40 mg p.o. QD #90, 4RF -Colon with miralax prep -Check wt qweeky. Pt to report if any wt loss. If wt loss, proceed with CT AP.  -Cutdown on alcohol and try to stop please. -D/W pt in detail.   Discussed risks & benefits of colonoscopy. Risks including rare perforation req laparotomy, bleeding after bx/polypectomy req blood transfusion, rarely missing neoplasms, risks of anesthesia/sedation, rare risk of damage to internal organs. Benefits outweigh the risks. Patient agrees to proceed. All the questions were answered. Pt consents to proceed.  HPI:    Robert Bruce is a 47 y.o. male  With history of hypertension, hyperlipidemia  For follow-up visit.  Here for colonoscopy for colorectal cancer screening.  No further epigastric pain.  Denies having any nausea or vomiting.  No diarrhea or constipation.  No melena or hematochezia.  Denies having any abdominal pain.  Lost 10 pounds but has gained back 5.  Has cut down on alcohol use.  Currently drinking 3 to 4 shots of tequila per day  Found to have B12 deficiency-is currently on B12 supplements  No sodas, chocolates, chewing gums, artificial sweeteners and candy. No NSAIDs  No family history of colon cancer  Wt Readings from Last 3 Encounters:  11/07/22 187 lb (84.8 kg)  09/26/22 181 lb (82.1 kg)  08/15/22 191 lb (86.6 kg)   SH-married, works as Curator in acute.   Past Medical History:  Diagnosis Date   Asthma    Hypertension     Past Surgical History:  Procedure Laterality Date   lymph node removal      Family History  Problem Relation Age of Onset   Hyperlipidemia Mother    Hypertension Brother    Kidney disease Maternal Grandmother    Colon cancer Neg Hx    Esophageal cancer Neg Hx     Social History    Tobacco Use   Smoking status: Some Days    Packs/day: .25    Types: Cigarettes   Smokeless tobacco: Never  Vaping Use   Vaping Use: Never used  Substance Use Topics   Alcohol use: Yes    Comment: most days   Drug use: No    Current Outpatient Medications  Medication Sig Dispense Refill   atorvastatin (LIPITOR) 20 MG tablet Take 1 tablet (20 mg total) by mouth daily. 90 tablet 1   nadolol (CORGARD) 20 MG tablet TAKE 1 TABLET(20 MG) BY MOUTH DAILY 90 tablet 1   pantoprazole (PROTONIX) 40 MG tablet Take 1 tablet (40 mg total) by mouth daily. 90 tablet 0   thiamine (VITAMIN B1) 100 MG tablet Take 1 tablet (100 mg total) by mouth daily. 90 tablet 0   valsartan-hydrochlorothiazide (DIOVAN-HCT) 160-12.5 MG tablet Take 1 tablet by mouth daily. 90 tablet 1   No current facility-administered medications for this visit.    No Known Allergies  Review of Systems:  neg     Physical Exam:    BP 120/80   Pulse 64   Ht 6' (1.829 m)   Wt 187 lb (84.8 kg)   BMI 25.36 kg/m  Filed Weights   11/07/22 1034  Weight: 187 lb (84.8 kg)   Constitutional:  Well-developed, in no acute distress. Psychiatric: Normal mood and affect. Behavior is normal. HEENT: Pupils normal.  Conjunctivae are normal. No scleral icterus. Cardiovascular: Normal rate, regular rhythm. No edema Pulmonary/chest: Effort normal and breath sounds normal. No wheezing, rales or rhonchi. Abdominal: Soft, nondistended. Nontender. Bowel sounds active throughout. There are no masses palpable. No hepatomegaly. Rectal:  defered Neurological: Alert and oriented to person place and time. Skin: Skin is warm and dry. No rashes noted.  Data Reviewed: I have personally reviewed following labs and imaging studies  CBC:    Latest Ref Rng & Units 09/26/2022    9:05 AM 07/18/2022   10:24 AM 05/30/2018   11:50 AM  CBC  WBC 4.0 - 10.5 K/uL 5.7  5.6  5.7   Hemoglobin 13.0 - 17.0 g/dL 16.1  09.6  04.5   Hematocrit 39.0 - 52.0 %  40.5  45.7  42.8   Platelets 150.0 - 400.0 K/uL 251.0  301.0      CMP:    Latest Ref Rng & Units 09/26/2022    9:05 AM 07/18/2022   10:24 AM 10/30/2019    9:28 AM  CMP  Glucose 70 - 99 mg/dL 81  83  91   BUN 6 - 23 mg/dL Creatinine 0.40 - 1.50 mg/dL 4.09  8.11  9.14   Sodium 135 - 145 mEq/L 140  137  141   Potassium 3.5 - 5.1 mEq/L 4.0  4.0  4.9   Chloride 96 - 112 mEq/L 99  97  101   CO2 19 - 32 mEq/L Calcium 8.4 - 10.5 mg/dL 78.2  95.6  21.3   Total Protein 6.0 - 8.3 g/dL 7.8  8.8  8.0   Total Bilirubin 0.2 - 1.2 mg/dL 0.6  0.9  0.4   Alkaline Phos 39 - 117 U/L 51  52  56   AST 0 - 37 U/L 28  32  26   ALT 0 - 53 U/L Edman Circle, MD 11/07/2022, 10:49 AM  Cc: Henson, Vickie L, NP-C

## 2022-11-08 ENCOUNTER — Other Ambulatory Visit: Payer: Self-pay | Admitting: Family Medicine

## 2022-11-08 DIAGNOSIS — I10 Essential (primary) hypertension: Secondary | ICD-10-CM

## 2022-12-26 ENCOUNTER — Encounter: Payer: Self-pay | Admitting: Family Medicine

## 2022-12-26 ENCOUNTER — Ambulatory Visit: Payer: 59 | Admitting: Family Medicine

## 2022-12-26 ENCOUNTER — Ambulatory Visit (INDEPENDENT_AMBULATORY_CARE_PROVIDER_SITE_OTHER): Payer: 59 | Admitting: Family Medicine

## 2022-12-26 VITALS — BP 134/86 | HR 67 | Temp 97.6°F | Ht 72.0 in | Wt 186.0 lb

## 2022-12-26 DIAGNOSIS — E519 Thiamine deficiency, unspecified: Secondary | ICD-10-CM

## 2022-12-26 DIAGNOSIS — E639 Nutritional deficiency, unspecified: Secondary | ICD-10-CM | POA: Diagnosis not present

## 2022-12-26 DIAGNOSIS — E538 Deficiency of other specified B group vitamins: Secondary | ICD-10-CM

## 2022-12-26 DIAGNOSIS — Z789 Other specified health status: Secondary | ICD-10-CM | POA: Diagnosis not present

## 2022-12-26 DIAGNOSIS — I10 Essential (primary) hypertension: Secondary | ICD-10-CM | POA: Diagnosis not present

## 2022-12-26 DIAGNOSIS — E7800 Pure hypercholesterolemia, unspecified: Secondary | ICD-10-CM | POA: Diagnosis not present

## 2022-12-26 NOTE — Progress Notes (Signed)
Subjective:     Patient ID: Robert Bruce, male    DOB: August 28, 1975, 47 y.o.   MRN: [id number]  Chief Complaint  Patient presents with   Medical Management of Chronic Issues    3 month f/u    HPI  Discussed the use of AI scribe software for clinical note transcription with the patient, who gave verbal consent to proceed.  History of Present Illness         Here to follow up on chronic health conditions.   Did not pick up thiamine prescription at pharmacy, was not aware.   He is not sure which medications he is currently taking.   He did receive B12 injections and now taking OTC B12.   Diet has not improved much. He has only eaten a bag of potato chips today.   Reports cutting back on alcohol use.   Weight has been stable.   Upcoming colonoscopy.     Health Maintenance Due  Topic Date Due   COVID-19 Vaccine (1) Never done   DTaP/Tdap/Td (1 - Tdap) Never done    Past Medical History:  Diagnosis Date   Asthma    Hypertension     Past Surgical History:  Procedure Laterality Date   lymph node removal      Family History  Problem Relation Age of Onset   Hyperlipidemia Mother    Hypertension Brother    Kidney disease Maternal Grandmother    Colon cancer Neg Hx    Esophageal cancer Neg Hx     Social History   Socioeconomic History   Marital status: Married    Spouse name: Not on file   Number of children: Not on file   Years of education: Not on file   Highest education level: Not on file  Occupational History   Not on file  Tobacco Use   Smoking status: Some Days    Packs/day: .25    Types: Cigarettes   Smokeless tobacco: Never  Vaping Use   Vaping Use: Never used  Substance and Sexual Activity   Alcohol use: Yes    Comment: most days   Drug use: No   Sexual activity: Not on file  Other Topics Concern   Not on file  Social History Narrative   Not on file   Social Determinants of Health   Financial Resource Strain: Not on file   Food Insecurity: Not on file  Transportation Needs: Not on file  Physical Activity: Not on file  Stress: Not on file  Social Connections: Not on file  Intimate Partner Violence: Not on file    Outpatient Medications Prior to Visit  Medication Sig Dispense Refill   atorvastatin (LIPITOR) 20 MG tablet Take 1 tablet (20 mg total) by mouth daily. 90 tablet 1   nadolol (CORGARD) 20 MG tablet TAKE 1 TABLET(20 MG) BY MOUTH DAILY 90 tablet 1   pantoprazole (PROTONIX) 40 MG tablet Take 1 tablet (40 mg total) by mouth daily. 90 tablet 0   pantoprazole (PROTONIX) 40 MG tablet Take 1 tablet (40 mg total) by mouth daily. 90 tablet 4   valsartan-hydrochlorothiazide (DIOVAN-HCT) 160-12.5 MG tablet Take 1 tablet by mouth daily. 90 tablet 1   thiamine (VITAMIN B1) 100 MG tablet Take 1 tablet (100 mg total) by mouth daily. (Patient not taking: Reported on 12/26/2022) 90 tablet 0   No facility-administered medications prior to visit.    No Known Allergies  Review of Systems  Constitutional:  Negative for chills,  fever and weight loss.  Respiratory:  Negative for hemoptysis and shortness of breath.   Cardiovascular:  Negative for chest pain and palpitations.  Gastrointestinal:  Negative for abdominal pain, constipation, diarrhea, nausea and vomiting.  Genitourinary:  Negative for dysuria and urgency.  Neurological:  Negative for dizziness and weakness.       Objective:    Physical Exam Constitutional:      General: He is not in acute distress.    Appearance: He is not ill-appearing.  Eyes:     Extraocular Movements: Extraocular movements intact.     Conjunctiva/sclera: Conjunctivae normal.  Cardiovascular:     Rate and Rhythm: Normal rate.  Pulmonary:     Effort: Pulmonary effort is normal.  Musculoskeletal:     Cervical back: Normal range of motion.  Skin:    General: Skin is warm and dry.  Neurological:     General: No focal deficit present.     Mental Status: He is alert and  oriented to person, place, and time.  Psychiatric:        Mood and Affect: Mood normal.        Behavior: Behavior normal.        Thought Content: Thought content normal.      BP 134/86 (BP Location: Left Arm, Patient Position: Sitting, Cuff Size: Large)   Pulse 67   Temp 97.6 F (36.4 C) (Temporal)   Ht 6' (1.829 m)   Wt 186 lb (84.4 kg)   SpO2 99%   BMI 25.23 kg/m  Wt Readings from Last 3 Encounters:  12/26/22 186 lb (84.4 kg)  11/07/22 187 lb (84.8 kg)  09/26/22 181 lb (82.1 kg)       Assessment & Plan:   Problem List Items Addressed This Visit       Cardiovascular and Mediastinum   Primary hypertension     Other   Daily consumption of alcohol   Hypercholesterolemia with LDL greater than 190 mg/dL - Primary   Poor diet   Other Visit Diagnoses     Thiamine deficiency       B12 deficiency          He is unsure which medications he is taking and which ones he has not yet picked up at the pharmacy.  He is quite certain he is not taking thiamine.  Advised him to contact his pharmacy and check his medications at home to ensure that he is taking all of his medications appropriately.  He will call back with any concerns. Continue to work on healthy diet and alcohol cessation. Follow-up for fasting office visit in 6 weeks after being on his statin daily.  I am having Taurean L. Asato maintain his pantoprazole, atorvastatin, valsartan-hydrochlorothiazide, thiamine, nadolol, and pantoprazole.  No orders of the defined types were placed in this encounter.

## 2022-12-26 NOTE — Patient Instructions (Signed)
Please check with your pharmacy and make sure you are taking all of the prescribed medications.  Follow up in 6 weeks fasting (nothing to eat or drink except water).

## 2022-12-27 ENCOUNTER — Encounter: Payer: Self-pay | Admitting: Family Medicine

## 2022-12-27 NOTE — Telephone Encounter (Signed)
Pt is taking atorvastatin 20 mg, pantoprazole 40 mg and nadolol 20 mg

## 2023-01-01 ENCOUNTER — Encounter: Payer: Self-pay | Admitting: Certified Registered Nurse Anesthetist

## 2023-01-07 ENCOUNTER — Encounter: Payer: Self-pay | Admitting: Gastroenterology

## 2023-01-07 ENCOUNTER — Ambulatory Visit (AMBULATORY_SURGERY_CENTER): Payer: 59 | Admitting: Gastroenterology

## 2023-01-07 VITALS — BP 158/96 | HR 60 | Temp 97.8°F | Resp 12 | Ht 72.0 in | Wt 187.0 lb

## 2023-01-07 DIAGNOSIS — K621 Rectal polyp: Secondary | ICD-10-CM | POA: Diagnosis not present

## 2023-01-07 DIAGNOSIS — D128 Benign neoplasm of rectum: Secondary | ICD-10-CM

## 2023-01-07 DIAGNOSIS — Z1211 Encounter for screening for malignant neoplasm of colon: Secondary | ICD-10-CM

## 2023-01-07 MED ORDER — SODIUM CHLORIDE 0.9 % IV SOLN: 500.0000 mL | INTRAVENOUS | Status: AC

## 2023-01-07 NOTE — Progress Notes (Signed)
Chief Complaint: For colonoscopy  Referring Provider:  Avanell Shackleton, NP-C      ASSESSMENT AND PLAN;   #1. CRC screening #2. Wt loss (resolved) #3. GERD  Plan: -Continue Protonix 40 mg p.o. QD #90, 4RF -Colon with miralax prep -Check wt qweeky. Pt to report if any wt loss. If wt loss, proceed with CT AP.  -Cutdown on alcohol and try to stop please. -D/W pt in detail.   Discussed risks & benefits of colonoscopy. Risks including rare perforation req laparotomy, bleeding after bx/polypectomy req blood transfusion, rarely missing neoplasms, risks of anesthesia/sedation, rare risk of damage to internal organs. Benefits outweigh the risks. Patient agrees to proceed. All the questions were answered. Pt consents to proceed.  HPI:    Robert Bruce is a 47 y.o. male  With history of hypertension, hyperlipidemia  For follow-up visit.  Here for colonoscopy for colorectal cancer screening.  No further epigastric pain.  Denies having any nausea or vomiting.  No diarrhea or constipation.  No melena or hematochezia.  Denies having any abdominal pain.  Lost 10 pounds but has gained back 5.  Has cut down on alcohol use.  Currently drinking 3 to 4 shots of tequila per day  Found to have B12 deficiency-is currently on B12 supplements  No sodas, chocolates, chewing gums, artificial sweeteners and candy. No NSAIDs  No family history of colon cancer  Wt Readings from Last 3 Encounters:  01/07/23 187 lb (84.8 kg)  12/26/22 186 lb (84.4 kg)  11/07/22 187 lb (84.8 kg)   SH-married, works as Curator in acute.   Past Medical History:  Diagnosis Date   Asthma    Hypertension     Past Surgical History:  Procedure Laterality Date   lymph node removal      Family History  Problem Relation Age of Onset   Hyperlipidemia Mother    Hypertension Brother    Kidney disease Maternal Grandmother    Colon cancer Neg Hx    Esophageal cancer Neg Hx     Social History    Tobacco Use   Smoking status: Some Days    Packs/day: .25    Types: Cigarettes   Smokeless tobacco: Never  Vaping Use   Vaping Use: Never used  Substance Use Topics   Alcohol use: Yes    Alcohol/week: 9.0 standard drinks of alcohol    Types: 9 Shots of liquor per week    Comment: most days   Drug use: No    Current Outpatient Medications  Medication Sig Dispense Refill   cyanocobalamin (VITAMIN B12) 500 MCG tablet Take 500 mcg by mouth daily.     atorvastatin (LIPITOR) 20 MG tablet Take 1 tablet (20 mg total) by mouth daily. 90 tablet 1   nadolol (CORGARD) 20 MG tablet TAKE 1 TABLET(20 MG) BY MOUTH DAILY 90 tablet 1   pantoprazole (PROTONIX) 40 MG tablet Take 1 tablet (40 mg total) by mouth daily. 90 tablet 0   pantoprazole (PROTONIX) 40 MG tablet Take 1 tablet (40 mg total) by mouth daily. 90 tablet 4   thiamine (VITAMIN B1) 100 MG tablet Take 1 tablet (100 mg total) by mouth daily. 90 tablet 0   valsartan-hydrochlorothiazide (DIOVAN-HCT) 160-12.5 MG tablet Take 1 tablet by mouth daily. 90 tablet 1   Current Facility-Administered Medications  Medication Dose Route Frequency Provider Last Rate Last Admin   0.9 %  sodium chloride infusion  500 mL Intravenous Continuous Lynann Bologna, MD  No Known Allergies  Review of Systems:  neg     Physical Exam:    BP (!) 179/107   Pulse (!) 57   Temp 97.8 F (36.6 C)   Resp 11   Ht 6' (1.829 m)   Wt 187 lb (84.8 kg)   SpO2 100%   BMI 25.36 kg/m  Filed Weights   01/07/23 1231  Weight: 187 lb (84.8 kg)   Constitutional:  Well-developed, in no acute distress. Psychiatric: Normal mood and affect. Behavior is normal. HEENT: Pupils normal.  Conjunctivae are normal. No scleral icterus. Cardiovascular: Normal rate, regular rhythm. No edema Pulmonary/chest: Effort normal and breath sounds normal. No wheezing, rales or rhonchi. Abdominal: Soft, nondistended. Nontender. Bowel sounds active throughout. There are no masses  palpable. No hepatomegaly. Rectal:  defered Neurological: Alert and oriented to person place and time. Skin: Skin is warm and dry. No rashes noted.  Data Reviewed: I have personally reviewed following labs and imaging studies  CBC:    Latest Ref Rng & Units 09/26/2022    9:05 AM 07/18/2022   10:24 AM 05/30/2018   11:50 AM  CBC  WBC 4.0 - 10.5 K/uL 5.7  5.6  5.7   Hemoglobin 13.0 - 17.0 g/dL 40.9  81.1  91.4   Hematocrit 39.0 - 52.0 % 40.5  45.7  42.8   Platelets 150.0 - 400.0 K/uL 251.0  301.0      CMP:    Latest Ref Rng & Units 09/26/2022    9:05 AM 07/18/2022   10:24 AM 10/30/2019    9:28 AM  CMP  Glucose 70 - 99 mg/dL 81  83  91   BUN 6 - 23 mg/dL 16  14  11    Creatinine 0.40 - 1.50 mg/dL 7.82  9.56  2.13   Sodium 135 - 145 mEq/L 140  137  141   Potassium 3.5 - 5.1 mEq/L 4.0  4.0  4.9   Chloride 96 - 112 mEq/L 99  97  101   CO2 19 - 32 mEq/L 29  28  21    Calcium 8.4 - 10.5 mg/dL 08.6  57.8  46.9   Total Protein 6.0 - 8.3 g/dL 7.8  8.8  8.0   Total Bilirubin 0.2 - 1.2 mg/dL 0.6  0.9  0.4   Alkaline Phos 39 - 117 U/L 51  52  56   AST 0 - 37 U/L 28  32  26   ALT 0 - 53 U/L 25  26  25        Edman Circle, MD 01/07/2023, 1:31 PM  Cc: Henson, Vickie L, NP-C

## 2023-01-07 NOTE — Progress Notes (Signed)
Report given to PACU, vss 

## 2023-01-07 NOTE — Patient Instructions (Signed)
YOU HAD AN ENDOSCOPIC PROCEDURE TODAY AT THE [facility]:   Refer to the procedure report that was given to you for any specific questions about what was found during the examination.  If the procedure report does not answer your questions, please call your gastroenterologist to clarify.  If you requested that your care partner not be given the details of your procedure findings, then the procedure report has been included in a sealed envelope for you to review at your convenience later.  YOU SHOULD EXPECT: Some feelings of bloating in the abdomen. Passage of more gas than usual.  Walking can help get rid of the air that was put into your GI tract during the procedure and reduce the bloating. If you had a lower endoscopy (such as a colonoscopy or flexible sigmoidoscopy) you may notice spotting of blood in your stool or on the toilet paper. If you underwent a bowel prep for your procedure, you may not have a normal bowel movement for a few days.  Please Note:  You might notice some irritation and congestion in your nose or some drainage.  This is from the oxygen used during your procedure.  There is no need for concern and it should clear up in a day or so.  SYMPTOMS TO REPORT IMMEDIATELY:  Following lower endoscopy (colonoscopy or flexible sigmoidoscopy):  Excessive amounts of blood in the stool  Significant tenderness or worsening of abdominal pains  Swelling of the abdomen that is new, acute  Fever of 100F or higher   For urgent or emergent issues, a gastroenterologist can be reached at any hour by calling [phone]. Do not use MyChart messaging for urgent concerns.    DIET:  We do recommend a small meal at first, but then you may proceed to your regular diet.  Drink plenty of fluids but you should avoid alcoholic beverages for 24 hours.  MEDICATIONS: Continue present medications.  FOLLOW UP: Repeat colonoscopy for surveillance based on pathology results. Follow up in GI  clinic if you have any GI problems.  Please see handouts given to you by your recovery nurse: Polyps, Diverticulosis, Hemorrhoids.  Thank you for allowing [place] to provide for your healthcare needs today.  ACTIVITY:  You should plan to take it easy for the rest of today and you should NOT DRIVE or use heavy machinery until tomorrow (because of the sedation medicines used during the test).    FOLLOW UP: Our staff will call the number listed on your records the next business day following your procedure.  We will call around 7:15- 8:00 am to check on you and address any questions or concerns that you may have regarding the information given to you following your procedure. If we do not reach you, we will leave a message.     If any biopsies were taken you will be contacted by phone or by letter within the next 1-3 weeks.  Please call us at [phone] if you have not heard about the biopsies in 3 weeks.    SIGNATURES/CONFIDENTIALITY: You and/or your care partner have signed paperwork which will be entered into your electronic medical record.  These signatures attest to the fact that that the information above on your After Visit Summary has been reviewed and is understood.  Full responsibility of the confidentiality of this discharge information lies with you and/or your care-partner.

## 2023-01-07 NOTE — Op Note (Signed)
[facility] Patient Name: Robert Bruce Procedure Date: 01/07/2023 12:43 PM MRN: [id number] Endoscopist: Lynann Bologna , MD, [id number] Age: 47 Referring MD:  Date of Birth: 06-29-1976 Gender: Male Account #: [id number] Procedure:                Colonoscopy Indications:              Screening for colorectal malignant neoplasm Medicines:                Monitored Anesthesia Care Procedure:                Pre-Anesthesia Assessment:                           - Prior to the procedure, a History and Physical                            was performed, and patient medications and                            allergies were reviewed. The patient's tolerance of                            previous anesthesia was also reviewed. The risks                            and benefits of the procedure and the sedation                            options and risks were discussed with the patient.                            All questions were answered, and informed consent                            was obtained. Prior Anticoagulants: The patient has                            taken no anticoagulant or antiplatelet agents. ASA                            Grade Assessment: I - A normal, healthy patient.                            After reviewing the risks and benefits, the patient                            was deemed in satisfactory condition to undergo the                            procedure.                           After obtaining informed consent, the colonoscope  was passed under direct vision. Throughout the                            procedure, the patient's blood pressure, pulse, and                            oxygen saturations were monitored continuously. The                            [id number]-[id number] Colonoscope [id number] was introduced                            through the anus and advanced to the 2 cm into the                            ileum. The colonoscopy was  performed without                            difficulty. The patient tolerated the procedure                            well. The quality of the bowel preparation was                            good. The terminal ileum, ileocecal valve,                            appendiceal orifice, and rectum were photographed. Scope In: 1:32:45 PM Scope Out: 1:43:16 PM Scope Withdrawal Time: 0 hours 6 minutes 56 seconds  Total Procedure Duration: 0 hours 10 minutes 31 seconds  Findings:                 A 4 mm polyp was found in the distal rectum. The                            polyp was sessile. The polyp was removed with a                            cold snare. Resection and retrieval were complete.                           Rare small-mouthed diverticula were found in the                            sigmoid colon.                           Non-bleeding internal hemorrhoids were found during                            retroflexion. The hemorrhoids were small and Grade                            I (internal hemorrhoids that do not prolapse).  The terminal ileum appeared normal.                           The exam was otherwise without abnormality on                            direct and retroflexion views. Complications:            No immediate complications. Estimated Blood Loss:     Estimated blood loss: none. Impression:               - One 4 mm polyp in the distal rectum, removed with                            a cold snare. Resected and retrieved.                           - Very minimal sigmoid diverticulosis.                           - Non-bleeding internal hemorrhoids.                           - The examined portion of the ileum was normal.                           - The examination was otherwise normal on direct                            and retroflexion views. Recommendation:           - Patient has a contact number available for                             emergencies. The signs and symptoms of potential                            delayed complications were discussed with the                            patient. Return to normal activities tomorrow.                            Written discharge instructions were provided to the                            patient.                           - Resume previous diet.                           - Continue present medications.                           - Await pathology results.                           -  Repeat colonoscopy for surveillance based on                            pathology results.                           - FU GI if any GI problems.                           - The findings and recommendations were discussed                            with the patient's family. Lynann Bologna, MD 01/07/2023 1:47:53 PM This report has been signed electronically.

## 2023-01-07 NOTE — Progress Notes (Signed)
Pt's states no medical or surgical changes since previsit or office visit. 

## 2023-01-07 NOTE — Progress Notes (Signed)
Called to room to assist during endoscopic procedure.  Patient ID and intended procedure confirmed with present staff. Received instructions for my participation in the procedure from the performing physician.  

## 2023-01-08 ENCOUNTER — Telehealth: Payer: Self-pay

## 2023-01-08 NOTE — Telephone Encounter (Signed)
  Follow up Call-     01/07/2023   12:32 PM  Call back number  Post procedure Call Back phone  # [phone]  Permission to leave phone message Yes    Post op call attempted, no answer, left WM.

## 2023-01-18 ENCOUNTER — Encounter: Payer: Self-pay | Admitting: Gastroenterology

## 2023-01-20 ENCOUNTER — Other Ambulatory Visit: Payer: Self-pay | Admitting: Family Medicine

## 2023-01-20 DIAGNOSIS — E7800 Pure hypercholesterolemia, unspecified: Secondary | ICD-10-CM

## 2023-02-06 ENCOUNTER — Other Ambulatory Visit: Payer: Self-pay | Admitting: Family Medicine

## 2023-02-06 ENCOUNTER — Encounter: Payer: Self-pay | Admitting: Family Medicine

## 2023-02-06 ENCOUNTER — Ambulatory Visit (INDEPENDENT_AMBULATORY_CARE_PROVIDER_SITE_OTHER): Payer: 59 | Admitting: Family Medicine

## 2023-02-06 VITALS — BP 124/84 | HR 70 | Temp 97.6°F | Ht 72.0 in | Wt 176.0 lb

## 2023-02-06 DIAGNOSIS — E538 Deficiency of other specified B group vitamins: Secondary | ICD-10-CM

## 2023-02-06 DIAGNOSIS — R112 Nausea with vomiting, unspecified: Secondary | ICD-10-CM

## 2023-02-06 DIAGNOSIS — E7800 Pure hypercholesterolemia, unspecified: Secondary | ICD-10-CM | POA: Diagnosis not present

## 2023-02-06 DIAGNOSIS — Z789 Other specified health status: Secondary | ICD-10-CM | POA: Diagnosis not present

## 2023-02-06 DIAGNOSIS — R634 Abnormal weight loss: Secondary | ICD-10-CM | POA: Diagnosis not present

## 2023-02-06 DIAGNOSIS — E876 Hypokalemia: Secondary | ICD-10-CM

## 2023-02-06 DIAGNOSIS — R197 Diarrhea, unspecified: Secondary | ICD-10-CM | POA: Diagnosis not present

## 2023-02-06 DIAGNOSIS — I10 Essential (primary) hypertension: Secondary | ICD-10-CM

## 2023-02-06 DIAGNOSIS — F321 Major depressive disorder, single episode, moderate: Secondary | ICD-10-CM | POA: Diagnosis not present

## 2023-02-06 DIAGNOSIS — E639 Nutritional deficiency, unspecified: Secondary | ICD-10-CM

## 2023-02-06 DIAGNOSIS — E519 Thiamine deficiency, unspecified: Secondary | ICD-10-CM

## 2023-02-06 LAB — LIPID PANEL
Cholesterol: 210 mg/dL — ABNORMAL HIGH (ref 0–200)
HDL: 51.1 mg/dL (ref >39.00)
NonHDL: 158.47
Total CHOL/HDL Ratio: 4
Triglycerides: 295 mg/dL — ABNORMAL HIGH (ref 0.0–149.0)
VLDL: 59 mg/dL — ABNORMAL HIGH (ref 0.0–40.0)

## 2023-02-06 LAB — COMPREHENSIVE METABOLIC PANEL
ALT: 22 U/L (ref 0–53)
AST: 23 U/L (ref 0–37)
Albumin: 4.7 g/dL (ref 3.5–5.2)
Alkaline Phosphatase: 47 U/L (ref 39–117)
BUN: 11 mg/dL (ref 6–23)
CO2: 30 mEq/L (ref 19–32)
Calcium: 9.9 mg/dL (ref 8.4–10.5)
Chloride: 101 mEq/L (ref 96–112)
Creatinine, Ser: 0.83 mg/dL (ref 0.40–1.50)
GFR: 105.29 mL/min (ref >60.00)
Glucose, Bld: 93 mg/dL (ref 70–99)
Potassium: 3.4 mEq/L — ABNORMAL LOW (ref 3.5–5.1)
Sodium: 143 mEq/L (ref 135–145)
Total Bilirubin: 0.4 mg/dL (ref 0.2–1.2)
Total Protein: 7.8 g/dL (ref 6.0–8.3)

## 2023-02-06 LAB — CBC WITH DIFFERENTIAL/PLATELET
Basophils Absolute: 0 10*3/uL (ref 0.0–0.1)
Basophils Relative: 0.5 % (ref 0.0–3.0)
Eosinophils Absolute: 0.1 10*3/uL (ref 0.0–0.7)
Eosinophils Relative: 3.5 % (ref 0.0–5.0)
HCT: 39.6 % (ref 39.0–52.0)
Hemoglobin: 13.1 g/dL (ref 13.0–17.0)
Lymphocytes Relative: 51.3 % — ABNORMAL HIGH (ref 12.0–46.0)
Lymphs Abs: 2 10*3/uL (ref 0.7–4.0)
MCHC: 33.2 g/dL (ref 30.0–36.0)
MCV: 90 fl (ref 78.0–100.0)
Monocytes Absolute: 0.4 10*3/uL (ref 0.1–1.0)
Monocytes Relative: 9.3 % (ref 3.0–12.0)
Neutro Abs: 1.4 10*3/uL (ref 1.4–7.7)
Neutrophils Relative %: 35.4 % — ABNORMAL LOW (ref 43.0–77.0)
Platelets: 229 10*3/uL (ref 150.0–400.0)
RBC: 4.4 Mil/uL (ref 4.22–5.81)
RDW: 14.2 % (ref 11.5–15.5)
WBC: 3.9 10*3/uL — ABNORMAL LOW (ref 4.0–10.5)

## 2023-02-06 LAB — LIPASE: Lipase: 30 U/L (ref 11.0–59.0)

## 2023-02-06 LAB — TSH: TSH: 1.73 u[IU]/mL (ref 0.35–5.50)

## 2023-02-06 LAB — VITAMIN B12: Vitamin B-12: 1151 pg/mL — ABNORMAL HIGH (ref 211–911)

## 2023-02-06 LAB — LDL CHOLESTEROL, DIRECT: Direct LDL: 106 mg/dL

## 2023-02-06 MED ORDER — POTASSIUM CHLORIDE CRYS ER 10 MEQ PO TBCR
10.0000 meq | EXTENDED_RELEASE_TABLET | Freq: Every day | ORAL | 1 refills | Status: AC
Start: 2023-02-06 — End: ?

## 2023-02-06 MED ORDER — PANTOPRAZOLE SODIUM 40 MG PO TBEC: 40.0000 mg | DELAYED_RELEASE_TABLET | Freq: Every day | ORAL | 0 refills | Status: DC

## 2023-02-06 NOTE — Assessment & Plan Note (Signed)
11 lb weight loss since June 2024. Most likely due to daily alcohol use and poor diet but cannot rule out underlying malignant etiology.  Recent colonoscopy for colon cancer screening. PSA normal. CT abdomen/pelvis ordered

## 2023-02-06 NOTE — Patient Instructions (Addendum)
Please go downstairs for labs before you leave.  You will receive a call to schedule a CT scan of your abdomen and pelvis.  Continue taking all of your current medications.  I refilled your pantoprazole which is for stomach acid.  Make sure you are taking this.  Add a Boost or a similar high calorie drink to your day to get more calories, protein and nutrition.   Please call or go to [facility] for help stopping alcohol. [phone] address is [address] [place], [place]   Another option is [facility], [address], [address], [place]. [phone]  I placed a referral to psychiatry and someone will call you to schedule. This referral is for anxiety, depression and alcohol dependence.   We will be in touch with your results.

## 2023-02-06 NOTE — Assessment & Plan Note (Signed)
Referral to psychiatry.  He does not appear to be in any danger.  Denies thoughts of self-harm.  He would like to stop drinking alcohol.  I also provided him with addresses and phone numbers for [facility] and [facility].

## 2023-02-06 NOTE — Assessment & Plan Note (Signed)
Continue supplement and check B12 level

## 2023-02-06 NOTE — Assessment & Plan Note (Signed)
Most likely related to alcohol use. CT abdomen/pelvis ordered. Referral back to GI if needed. Refilled Protonix.

## 2023-02-06 NOTE — Assessment & Plan Note (Signed)
He is requesting help to stop drinking alcohol.  States he is not able to do inpatient therapy at this time but is interested in outpatient.  I provided him with names of facilities that can help him including [facility] and [facility].  Encouraged him to call.  Referral to psychiatry as well

## 2023-02-06 NOTE — Assessment & Plan Note (Signed)
 Check lipid panel.  Continue statin therapy.

## 2023-02-06 NOTE — Assessment & Plan Note (Signed)
Blood pressure improving.  Close to goal.  Continue current medications.  Continue working on stopping alcohol use.

## 2023-02-06 NOTE — Progress Notes (Signed)
Subjective:     Patient ID: Robert Bruce, male    DOB: [birth year], 47 y.o.   MRN: [id number]  Chief Complaint  Patient presents with   Medical Management of Chronic Issues    6 week f/u, fasting    HPI  Discussed the use of AI scribe software for clinical note transcription with the patient, who gave verbal consent to proceed.  History of Present Illness         C/o N/V/D. Vomiting every morning. Weight loss. Lost 11 lbs in the past month.  Drinking more alcohol recently. Diet is poor. No appetite. Denies hematemesis or hemoptysis or hematochezia.  He does admit to depression and and has increased stress and anxiety more recently. He denies SI.  Works in a hot environment.   Reports taking statin and all medications as recommended.    Health Maintenance Due  Topic Date Due   DTaP/Tdap/Td (1 - Tdap) Never done    Past Medical History:  Diagnosis Date   Asthma    Hypertension     Past Surgical History:  Procedure Laterality Date   lymph node removal      Family History  Problem Relation Age of Onset   Hyperlipidemia Mother    Hypertension Brother    Kidney disease Maternal Grandmother    Colon cancer Neg Hx    Esophageal cancer Neg Hx     Social History   Socioeconomic History   Marital status: Married    Spouse name: Not on file   Number of children: Not on file   Years of education: Not on file   Highest education level: Not on file  Occupational History   Not on file  Tobacco Use   Smoking status: Some Days    Current packs/day: 0.25    Types: Cigarettes   Smokeless tobacco: Never  Vaping Use   Vaping status: Never Used  Substance and Sexual Activity   Alcohol use: Yes    Alcohol/week: 9.0 standard drinks of alcohol    Types: 9 Shots of liquor per week    Comment: most days   Drug use: No   Sexual activity: Not on file  Other Topics Concern   Not on file  Social History Narrative   Not on file   Social Determinants of  Health   Financial Resource Strain: Not on file  Food Insecurity: Not on file  Transportation Needs: Not on file  Physical Activity: Not on file  Stress: Not on file  Social Connections: Not on file  Intimate Partner Violence: Not on file    Outpatient Medications Prior to Visit  Medication Sig Dispense Refill   atorvastatin (LIPITOR) 20 MG tablet TAKE 1 TABLET(20 MG) BY MOUTH DAILY 90 tablet 1   cyanocobalamin (VITAMIN B12) 500 MCG tablet Take 500 mcg by mouth daily.     nadolol (CORGARD) 20 MG tablet TAKE 1 TABLET(20 MG) BY MOUTH DAILY 90 tablet 1   pantoprazole (PROTONIX) 40 MG tablet Take 1 tablet (40 mg total) by mouth daily. 90 tablet 4   thiamine (VITAMIN B1) 100 MG tablet Take 1 tablet (100 mg total) by mouth daily. 90 tablet 0   valsartan-hydrochlorothiazide (DIOVAN-HCT) 160-12.5 MG tablet Take 1 tablet by mouth daily. 90 tablet 1   pantoprazole (PROTONIX) 40 MG tablet Take 1 tablet (40 mg total) by mouth daily. 90 tablet 0   Facility-Administered Medications Prior to Visit  Medication Dose Route Frequency Provider Last Rate Last Admin  0.9 %  sodium chloride infusion  500 mL Intravenous Continuous Lynann Bologna, MD        No Known Allergies  Review of Systems  Constitutional:  Positive for diaphoresis and weight loss. Negative for chills and fever.  Respiratory:  Negative for cough, hemoptysis and shortness of breath.   Cardiovascular:  Negative for chest pain, palpitations and leg swelling.  Gastrointestinal:  Positive for diarrhea, nausea and vomiting. Negative for abdominal pain and constipation.  Genitourinary:  Negative for dysuria, frequency and urgency.  Neurological:  Positive for dizziness. Negative for sensory change, focal weakness and headaches.  Psychiatric/Behavioral:  Positive for depression and substance abuse. Negative for suicidal ideas. The patient is nervous/anxious.        Objective:    Physical Exam Constitutional:      General: He is not in  acute distress.    Appearance: He is not ill-appearing.  HENT:     Mouth/Throat:     Mouth: Mucous membranes are moist.     Pharynx: Oropharynx is clear.  Eyes:     Extraocular Movements: Extraocular movements intact.     Conjunctiva/sclera: Conjunctivae normal.  Cardiovascular:     Rate and Rhythm: Normal rate and regular rhythm.  Pulmonary:     Effort: Pulmonary effort is normal.     Breath sounds: Normal breath sounds.  Abdominal:     General: Bowel sounds are normal. There is no distension.     Palpations: Abdomen is soft.     Tenderness: There is no abdominal tenderness. There is no right CVA tenderness, left CVA tenderness, guarding or rebound. Negative signs include Murphy's sign, McBurney's sign and psoas sign.  Musculoskeletal:     Cervical back: Normal range of motion and neck supple.  Skin:    General: Skin is warm and dry.  Neurological:     General: No focal deficit present.     Mental Status: He is alert and oriented to person, place, and time.     Cranial Nerves: No cranial nerve deficit.     Motor: No weakness.     Gait: Gait normal.  Psychiatric:        Attention and Perception: Attention normal.        Mood and Affect: Mood normal. Affect is tearful.        Speech: Speech normal.        Behavior: Behavior normal.        Thought Content: Thought content normal. Thought content does not include suicidal ideation.      BP 124/84 (BP Location: Left Arm, Patient Position: Sitting, Cuff Size: Large)   Pulse 70   Temp 97.6 F (36.4 C) (Temporal)   Ht 6' (1.829 m)   Wt 176 lb (79.8 kg)   SpO2 99%   BMI 23.87 kg/m  Wt Readings from Last 3 Encounters:  02/06/23 176 lb (79.8 kg)  01/07/23 187 lb (84.8 kg)  12/26/22 186 lb (84.4 kg)       Assessment & Plan:   Problem List Items Addressed This Visit       Cardiovascular and Mediastinum   Primary hypertension    Blood pressure improving.  Close to goal.  Continue current medications.  Continue working  on stopping alcohol use.      Relevant Orders   Comprehensive metabolic panel   CBC with Differential/Platelet   TSH     Digestive   Nausea, vomiting, and diarrhea    Most likely related to alcohol use. CT  abdomen/pelvis ordered. Referral back to GI if needed. Refilled Protonix.       Relevant Orders   Comprehensive metabolic panel   CBC with Differential/Platelet   Lipase   CT ABDOMEN PELVIS W CONTRAST     Other   B12 deficiency    Continue supplement and check B12 level       Relevant Orders   Vitamin B12   Daily consumption of alcohol    He is requesting help to stop drinking alcohol.  States he is not able to do inpatient therapy at this time but is interested in outpatient.  I provided him with names of facilities that can help him including [facility] and [facility].  Encouraged him to call.  Referral to psychiatry as well      Relevant Orders   Ambulatory referral to Psychiatry   Hypercholesterolemia with LDL greater than 190 mg/dL    Check lipid panel.  Continue statin therapy.      Relevant Orders   Lipid panel   Moderate major depression (HCC)    Referral to psychiatry.  He does not appear to be in any danger.  Denies thoughts of self-harm.  He would like to stop drinking alcohol.  I also provided him with addresses and phone numbers for the [facility] and [facility].      Relevant Orders   Ambulatory referral to Psychiatry   Poor diet    Check labs.  Daily alcohol use is most likely interfering with his diet.  Encouraged healthier choices with diet and staying hydrated. Add Boost      Thiamine deficiency    Continue supplement. Check B1 level today.       Relevant Orders   Vitamin B1   Weight loss, unintentional - Primary    11 lb weight loss since June 2024. Most likely due to daily alcohol use and poor diet but cannot rule out underlying malignant etiology.  Recent colonoscopy for colon cancer screening. PSA  normal. CT abdomen/pelvis ordered       Relevant Orders   Comprehensive metabolic panel   CBC with Differential/Platelet   TSH   Lipase   CT ABDOMEN PELVIS W CONTRAST    I am having Kelen L. Momon maintain his valsartan-hydrochlorothiazide, thiamine, nadolol, pantoprazole, cyanocobalamin, atorvastatin, and pantoprazole. We will continue to administer sodium chloride.  Meds ordered this encounter  Medications   pantoprazole (PROTONIX) 40 MG tablet    Sig: Take 1 tablet (40 mg total) by mouth daily.    Dispense:  90 tablet    Refill:  0    Order Specific Question:   Supervising Provider    Answer:   Hillard Danker A [[id number]]

## 2023-02-06 NOTE — Assessment & Plan Note (Signed)
Continue supplement. Check B1 level today.

## 2023-02-06 NOTE — Assessment & Plan Note (Signed)
Check labs.  Daily alcohol use is most likely interfering with his diet.  Encouraged healthier choices with diet and staying hydrated. Add Boost

## 2023-02-10 LAB — VITAMIN B1: Vitamin B1 (Thiamine): 32 nmol/L — ABNORMAL HIGH (ref 8–30)

## 2023-02-20 ENCOUNTER — Encounter (INDEPENDENT_AMBULATORY_CARE_PROVIDER_SITE_OTHER): Payer: Self-pay

## 2023-02-27 ENCOUNTER — Ambulatory Visit
Admission: RE | Admit: 2023-02-27 | Discharge: 2023-02-27 | Disposition: A | Discharge status: Home / Self Care | Payer: Managed Care, Other (non HMO) | Source: Ambulatory Visit | Attending: Family Medicine | Admitting: Family Medicine

## 2023-02-27 DIAGNOSIS — R112 Nausea with vomiting, unspecified: Secondary | ICD-10-CM

## 2023-02-27 DIAGNOSIS — R634 Abnormal weight loss: Secondary | ICD-10-CM

## 2023-02-27 MED ORDER — IOPAMIDOL (ISOVUE-300) INJECTION 61%
500.0000 mL | Freq: Once | INTRAVENOUS | Status: AC | PRN
  Administered 2023-02-27: 100 mL via INTRAVENOUS

## 2023-03-05 ENCOUNTER — Other Ambulatory Visit: Payer: Self-pay | Admitting: Family Medicine

## 2023-03-05 DIAGNOSIS — R634 Abnormal weight loss: Secondary | ICD-10-CM

## 2023-03-05 DIAGNOSIS — R935 Abnormal findings on diagnostic imaging of other abdominal regions, including retroperitoneum: Secondary | ICD-10-CM | POA: Insufficient documentation

## 2023-03-05 DIAGNOSIS — E278 Other specified disorders of adrenal gland: Secondary | ICD-10-CM

## 2023-03-05 DIAGNOSIS — R112 Nausea with vomiting, unspecified: Secondary | ICD-10-CM

## 2023-03-05 NOTE — Progress Notes (Signed)
I would like to refer him to endocrinology for further evaluation of an adrenal mass seen on CT. It is benign in appearance but may be contributing to his symptoms. They will call him to schedule a visit.  Also, please ask him to follow up with GI due to the finding of possible inflammatory bowel disease. He had a colonoscopy in June but IBD can come and go and may not have been present during his procedure. This may or may not be contributing to his GI symptoms.

## 2023-03-20 ENCOUNTER — Ambulatory Visit (INDEPENDENT_AMBULATORY_CARE_PROVIDER_SITE_OTHER): Payer: Managed Care, Other (non HMO) | Admitting: Family Medicine

## 2023-03-20 ENCOUNTER — Encounter: Payer: Self-pay | Admitting: Family Medicine

## 2023-03-20 VITALS — BP 134/92 | HR 56 | Temp 97.1°F | Ht 72.0 in | Wt 179.0 lb

## 2023-03-20 DIAGNOSIS — I10 Essential (primary) hypertension: Secondary | ICD-10-CM | POA: Diagnosis not present

## 2023-03-20 DIAGNOSIS — Z789 Other specified health status: Secondary | ICD-10-CM

## 2023-03-20 DIAGNOSIS — F321 Major depressive disorder, single episode, moderate: Secondary | ICD-10-CM | POA: Diagnosis not present

## 2023-03-20 DIAGNOSIS — R197 Diarrhea, unspecified: Secondary | ICD-10-CM

## 2023-03-20 DIAGNOSIS — R112 Nausea with vomiting, unspecified: Secondary | ICD-10-CM

## 2023-03-20 DIAGNOSIS — R935 Abnormal findings on diagnostic imaging of other abdominal regions, including retroperitoneum: Secondary | ICD-10-CM | POA: Diagnosis not present

## 2023-03-20 NOTE — Patient Instructions (Addendum)
[facility]  [address] [place], [address] [place] [address] [phone]

## 2023-03-20 NOTE — Progress Notes (Signed)
Subjective:     Patient ID: Robert Bruce, male    DOB: 1976/02/27, 47 y.o.   MRN: [id number]  Chief Complaint  Patient presents with   Medical Management of Chronic Issues    6 week f/u    HPI  Discussed the use of AI scribe software for clinical note transcription with the patient, who gave verbal consent to proceed.  History of Present Illness          Here to follow up on abdominal pain, nausea and vomiting. CT abdomen abnormal.  His wife is with him today.  He has not schedule follow up with GI yet.  Gained 3 lbs. Eating slightly more.   He has not taken HTN medications today. Occasionally misses doses.   Wife states he is depressed. States he drinks alcohol and sleeps when at home.    He is seeing Dr. Talmage Nap later today for adrenal mass.     Health Maintenance Due  Topic Date Due   DTaP/Tdap/Td (1 - Tdap) Never done    Past Medical History:  Diagnosis Date   Asthma    Hypertension     Past Surgical History:  Procedure Laterality Date   lymph node removal      Family History  Problem Relation Age of Onset   Hyperlipidemia Mother    Hypertension Brother    Kidney disease Maternal Grandmother    Colon cancer Neg Hx    Esophageal cancer Neg Hx     Social History   Socioeconomic History   Marital status: Married    Spouse name: Not on file   Number of children: Not on file   Years of education: Not on file   Highest education level: Not on file  Occupational History   Not on file  Tobacco Use   Smoking status: Some Days    Current packs/day: 0.25    Types: Cigarettes   Smokeless tobacco: Never  Vaping Use   Vaping status: Never Used  Substance and Sexual Activity   Alcohol use: Yes    Alcohol/week: 9.0 standard drinks of alcohol    Types: 9 Shots of liquor per week    Comment: most days   Drug use: No   Sexual activity: Not on file  Other Topics Concern   Not on file  Social History Narrative   Not on file   Social  Determinants of Health   Financial Resource Strain: Not on file  Food Insecurity: Not on file  Transportation Needs: Not on file  Physical Activity: Not on file  Stress: Not on file  Social Connections: Not on file  Intimate Partner Violence: Not on file    Outpatient Medications Prior to Visit  Medication Sig Dispense Refill   atorvastatin (LIPITOR) 20 MG tablet TAKE 1 TABLET(20 MG) BY MOUTH DAILY 90 tablet 1   cyanocobalamin (VITAMIN B12) 500 MCG tablet Take 500 mcg by mouth daily.     nadolol (CORGARD) 20 MG tablet TAKE 1 TABLET(20 MG) BY MOUTH DAILY 90 tablet 1   pantoprazole (PROTONIX) 40 MG tablet Take 1 tablet (40 mg total) by mouth daily. 90 tablet 4   pantoprazole (PROTONIX) 40 MG tablet Take 1 tablet (40 mg total) by mouth daily. 90 tablet 0   potassium chloride (KLOR-CON M) 10 MEQ tablet Take 1 tablet (10 mEq total) by mouth daily. 30 tablet 1   thiamine (VITAMIN B1) 100 MG tablet Take 1 tablet (100 mg total) by mouth daily. 90 tablet  0   valsartan-hydrochlorothiazide (DIOVAN-HCT) 160-12.5 MG tablet Take 1 tablet by mouth daily. 90 tablet 1   Facility-Administered Medications Prior to Visit  Medication Dose Route Frequency Provider Last Rate Last Admin   0.9 %  sodium chloride infusion  500 mL Intravenous Continuous Lynann Bologna, MD        No Known Allergies  Review of Systems  Constitutional:  Positive for malaise/fatigue. Negative for chills and fever.  Respiratory:  Negative for shortness of breath.   Cardiovascular:  Negative for chest pain, palpitations and leg swelling.  Gastrointestinal:  Positive for abdominal pain, diarrhea, nausea and vomiting. Negative for constipation.  Genitourinary:  Negative for dysuria, frequency and urgency.  Neurological:  Negative for dizziness and focal weakness.  Psychiatric/Behavioral:  Positive for depression and substance abuse.        Objective:    Physical Exam Constitutional:      General: He is not in acute  distress.    Appearance: He is not ill-appearing.  Eyes:     Extraocular Movements: Extraocular movements intact.     Conjunctiva/sclera: Conjunctivae normal.  Cardiovascular:     Rate and Rhythm: Normal rate.  Pulmonary:     Effort: Pulmonary effort is normal.  Musculoskeletal:     Cervical back: Normal range of motion and neck supple.  Skin:    General: Skin is warm and dry.  Neurological:     General: No focal deficit present.     Mental Status: He is alert and oriented to person, place, and time.  Psychiatric:        Mood and Affect: Mood normal.        Behavior: Behavior normal.        Thought Content: Thought content normal.      BP (!) 134/92 (BP Location: Left Arm, Patient Position: Sitting, Cuff Size: Large)   Pulse (!) 56   Temp (!) 97.1 F (36.2 C) (Temporal)   Ht 6' (1.829 m)   Wt 179 lb (81.2 kg)   SpO2 100%   BMI 24.28 kg/m  Wt Readings from Last 3 Encounters:  03/20/23 179 lb (81.2 kg)  02/06/23 176 lb (79.8 kg)  01/07/23 187 lb (84.8 kg)        Assessment & Plan:   Problem List Items Addressed This Visit       Cardiovascular and Mediastinum   Primary hypertension - Primary    Not controlled. He did not take his medication today. Skips some days. Encourage good compliance. Monitor at home.         Digestive   Nausea, vomiting, and diarrhea    Continue PPI. Reduce alcohol intake. Referral back to GI CT abdomen IMPRESSION: 1. 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      Relevant Orders   Ambulatory referral to Gastroenterology     Other   Abnormal CT of the abdomen    IMPRESSION: 1. 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 2. 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 Recommend biochemical assessment for functionality and follow-up adrenal washout CT in 1 year. If stable for = 1 year, no further follow-up imaging. JACR 2017  Aug; 14(8):1038-44, JCAT 2016 Mar-Apr; 40(2):194-200, Urol J 2006 Spring; 3(2):71-4. 3.  Aortic Atherosclerosis (ICD10-I70.0).      Relevant Orders   Ambulatory referral to Gastroenterology   Daily consumption of alcohol    Self medicating. I have provided him with names of facilities that can help him including the [facility] and [facility].  Encouraged him to call.  Referral to psychiatry as well      Moderate major depression (HCC)    Referred to psychiatry. Advised him to call and schedule.   He would like to stop drinking alcohol.  I also provided him with addresses and phone numbers for the [facility] and [facility] in the past.        I am having Mathhew L. Dimascio maintain his valsartan-hydrochlorothiazide, thiamine, nadolol, pantoprazole, cyanocobalamin, atorvastatin, pantoprazole, and potassium chloride. We will continue to administer sodium chloride.  No orders of the defined types were placed in this encounter.

## 2023-03-20 NOTE — Assessment & Plan Note (Addendum)
IMPRESSION: 1. 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 2. 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 Recommend biochemical assessment for functionality and follow-up adrenal washout CT in 1 year. If stable for = 1 year, no further follow-up imaging. JACR 2017 Aug; 14(8):1038-44, JCAT 2016 Mar-Apr; 40(2):194-200, Urol J 2006 Spring; 3(2):71-4. 3.  Aortic Atherosclerosis (ICD10-I70.0).

## 2023-03-20 NOTE — Assessment & Plan Note (Signed)
Not controlled. He did not take his medication today. Skips some days. Encourage good compliance. Monitor at home.

## 2023-03-20 NOTE — Assessment & Plan Note (Signed)
Self medicating. I have provided him with names of facilities that can help him including the [facility] and [facility].  Encouraged him to call.  Referral to psychiatry as well

## 2023-03-20 NOTE — Assessment & Plan Note (Addendum)
Continue PPI. Reduce alcohol intake. Referral back to GI CT abdomen IMPRESSION: 1. 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

## 2023-03-20 NOTE — Assessment & Plan Note (Signed)
Referred to psychiatry. Advised him to call and schedule.   He would like to stop drinking alcohol.  I also provided him with addresses and phone numbers for the [facility] and [facility] [place] [facility] in the past.

## 2023-04-07 ENCOUNTER — Other Ambulatory Visit: Payer: Self-pay | Admitting: Family Medicine

## 2023-04-07 DIAGNOSIS — E876 Hypokalemia: Secondary | ICD-10-CM

## 2023-06-25 ENCOUNTER — Ambulatory Visit (INDEPENDENT_AMBULATORY_CARE_PROVIDER_SITE_OTHER): Payer: Managed Care, Other (non HMO) | Admitting: Family Medicine

## 2023-06-25 ENCOUNTER — Encounter: Payer: Self-pay | Admitting: Family Medicine

## 2023-06-25 VITALS — BP 138/92 | HR 70 | Temp 97.8°F | Ht 72.0 in | Wt 192.0 lb

## 2023-06-25 DIAGNOSIS — E519 Thiamine deficiency, unspecified: Secondary | ICD-10-CM

## 2023-06-25 DIAGNOSIS — I10 Essential (primary) hypertension: Secondary | ICD-10-CM | POA: Diagnosis not present

## 2023-06-25 DIAGNOSIS — K219 Gastro-esophageal reflux disease without esophagitis: Secondary | ICD-10-CM | POA: Insufficient documentation

## 2023-06-25 DIAGNOSIS — E7800 Pure hypercholesterolemia, unspecified: Secondary | ICD-10-CM

## 2023-06-25 DIAGNOSIS — E538 Deficiency of other specified B group vitamins: Secondary | ICD-10-CM | POA: Diagnosis not present

## 2023-06-25 DIAGNOSIS — Z789 Other specified health status: Secondary | ICD-10-CM

## 2023-06-25 DIAGNOSIS — R7303 Prediabetes: Secondary | ICD-10-CM | POA: Insufficient documentation

## 2023-06-25 DIAGNOSIS — R5383 Other fatigue: Secondary | ICD-10-CM | POA: Diagnosis not present

## 2023-06-25 DIAGNOSIS — Z91148 Patient's other noncompliance with medication regimen for other reason: Secondary | ICD-10-CM

## 2023-06-25 HISTORY — DX: Prediabetes: R73.03

## 2023-06-25 LAB — LIPID PANEL
Cholesterol: 277 mg/dL — ABNORMAL HIGH (ref 0–200)
HDL: 56 mg/dL (ref >39.00)
LDL Cholesterol: 194 mg/dL — ABNORMAL HIGH (ref 0–99)
NonHDL: 220.57
Total CHOL/HDL Ratio: 5
Triglycerides: 135 mg/dL (ref 0.0–149.0)
VLDL: 27 mg/dL (ref 0.0–40.0)

## 2023-06-25 LAB — COMPREHENSIVE METABOLIC PANEL
ALT: 13 U/L (ref 0–53)
AST: 17 U/L (ref 0–37)
Albumin: 4.6 g/dL (ref 3.5–5.2)
Alkaline Phosphatase: 43 U/L (ref 39–117)
BUN: 10 mg/dL (ref 6–23)
CO2: 28 meq/L (ref 19–32)
Calcium: 9.5 mg/dL (ref 8.4–10.5)
Chloride: 104 meq/L (ref 96–112)
Creatinine, Ser: 0.86 mg/dL (ref 0.40–1.50)
GFR: 103.88 mL/min (ref >60.00)
Glucose, Bld: 82 mg/dL (ref 70–99)
Potassium: 4.3 meq/L (ref 3.5–5.1)
Sodium: 140 meq/L (ref 135–145)
Total Bilirubin: 0.4 mg/dL (ref 0.2–1.2)
Total Protein: 7.8 g/dL (ref 6.0–8.3)

## 2023-06-25 LAB — CBC WITH DIFFERENTIAL/PLATELET
Basophils Absolute: 0 10*3/uL (ref 0.0–0.1)
Basophils Relative: 0.7 % (ref 0.0–3.0)
Eosinophils Absolute: 0.2 10*3/uL (ref 0.0–0.7)
Eosinophils Relative: 3.8 % (ref 0.0–5.0)
HCT: 42.6 % (ref 39.0–52.0)
Hemoglobin: 14 g/dL (ref 13.0–17.0)
Lymphocytes Relative: 33.5 % (ref 12.0–46.0)
Lymphs Abs: 2 10*3/uL (ref 0.7–4.0)
MCHC: 32.8 g/dL (ref 30.0–36.0)
MCV: 92.6 fL (ref 78.0–100.0)
Monocytes Absolute: 0.5 10*3/uL (ref 0.1–1.0)
Monocytes Relative: 8.1 % (ref 3.0–12.0)
Neutro Abs: 3.2 10*3/uL (ref 1.4–7.7)
Neutrophils Relative %: 53.9 % (ref 43.0–77.0)
Platelets: 299 10*3/uL (ref 150.0–400.0)
RBC: 4.6 Mil/uL (ref 4.22–5.81)
RDW: 14.3 % (ref 11.5–15.5)
WBC: 5.9 10*3/uL (ref 4.0–10.5)

## 2023-06-25 LAB — TSH: TSH: 1.57 u[IU]/mL (ref 0.35–5.50)

## 2023-06-25 LAB — VITAMIN B12: Vitamin B-12: 623 pg/mL (ref 211–911)

## 2023-06-25 LAB — VITAMIN D 25 HYDROXY (VIT D DEFICIENCY, FRACTURES): VITD: <7 ng/mL — ABNORMAL LOW (ref 30.00–100.00)

## 2023-06-25 LAB — FERRITIN: Ferritin: 94.1 ng/mL (ref 22.0–322.0)

## 2023-06-25 LAB — T4, FREE: Free T4: 0.64 ng/dL (ref 0.60–1.60)

## 2023-06-25 MED ORDER — ATORVASTATIN CALCIUM 20 MG PO TABS
20.0000 mg | ORAL_TABLET | Freq: Every day | ORAL | 1 refills | Status: DC
Start: 2023-06-25 — End: 2024-02-27

## 2023-06-25 NOTE — Patient Instructions (Addendum)
Please go downstairs for labs.   I recommend taking your medications for your blood pressure and cholesterol daily.   I also recommend taking vitamin B12 and thiamine. I will send in refills once I have your lab results.

## 2023-06-25 NOTE — Progress Notes (Signed)
Subjective:     Patient ID: Robert Bruce, male    DOB: [birth year], 47 y.o.   MRN: [id number]  Chief Complaint  Patient presents with   Medical Management of Chronic Issues    3 month f/u    HPI   History of Present Illness         Here for a follow up on chronic health conditions.   States he does not take his BP medications regularly. BP elevated.   States depression has improved.   Has appt with Dr. Talmage Nap on 07/23/2023 for adrenal mass.   States he is eating more. Drinking less alcohol   No abdominal pain, nausea or vomiting. Is not taking pantoprazole.   LDL 234 -11 months ago and he is not taking statin regularly.      Health Maintenance Due  Topic Date Due   DTaP/Tdap/Td (1 - Tdap) Never done    Past Medical History:  Diagnosis Date   Asthma    Hypertension    Prediabetes 06/25/2023    Past Surgical History:  Procedure Laterality Date   lymph node removal      Family History  Problem Relation Age of Onset   Hyperlipidemia Mother    Hypertension Brother    Kidney disease Maternal Grandmother    Colon cancer Neg Hx    Esophageal cancer Neg Hx     Social History   Socioeconomic History   Marital status: Married    Spouse name: Not on file   Number of children: Not on file   Years of education: Not on file   Highest education level: Not on file  Occupational History   Not on file  Tobacco Use   Smoking status: Some Days    Current packs/day: 0.25    Types: Cigarettes   Smokeless tobacco: Never  Vaping Use   Vaping status: Never Used  Substance and Sexual Activity   Alcohol use: Yes    Alcohol/week: 9.0 standard drinks of alcohol    Types: 9 Shots of liquor per week    Comment: most days   Drug use: No   Sexual activity: Not on file  Other Topics Concern   Not on file  Social History Narrative   Not on file   Social Determinants of Health   Financial Resource Strain: Not on file  Food Insecurity: Not on file   Transportation Needs: Not on file  Physical Activity: Not on file  Stress: Not on file  Social Connections: Not on file  Intimate Partner Violence: Not on file    Outpatient Medications Prior to Visit  Medication Sig Dispense Refill   cyanocobalamin (VITAMIN B12) 500 MCG tablet Take 500 mcg by mouth daily.     nadolol (CORGARD) 20 MG tablet TAKE 1 TABLET(20 MG) BY MOUTH DAILY 90 tablet 1   pantoprazole (PROTONIX) 40 MG tablet Take 1 tablet (40 mg total) by mouth daily. 90 tablet 0   thiamine (VITAMIN B1) 100 MG tablet Take 1 tablet (100 mg total) by mouth daily. 90 tablet 0   valsartan-hydrochlorothiazide (DIOVAN-HCT) 160-12.5 MG tablet Take 1 tablet by mouth daily. 90 tablet 1   potassium chloride (KLOR-CON M) 10 MEQ tablet Take 1 tablet (10 mEq total) by mouth daily. (Patient not taking: Reported on 06/25/2023) 30 tablet 1   atorvastatin (LIPITOR) 20 MG tablet TAKE 1 TABLET(20 MG) BY MOUTH DAILY (Patient not taking: Reported on 06/25/2023) 90 tablet 1   pantoprazole (PROTONIX) 40 MG tablet Take  1 tablet (40 mg total) by mouth daily. 90 tablet 4   Facility-Administered Medications Prior to Visit  Medication Dose Route Frequency Provider Last Rate Last Admin   0.9 %  sodium chloride infusion  500 mL Intravenous Continuous Lynann Bologna, MD        No Known Allergies  Review of Systems  Constitutional:  Positive for malaise/fatigue. Negative for chills, fever and weight loss.  Respiratory:  Negative for shortness of breath.   Cardiovascular:  Negative for chest pain, palpitations and leg swelling.  Gastrointestinal:  Negative for abdominal pain, constipation, diarrhea, nausea and vomiting.  Genitourinary:  Negative for dysuria, frequency and urgency.  Neurological:  Negative for dizziness, tingling, focal weakness and headaches.  Psychiatric/Behavioral:  Positive for depression. Negative for suicidal ideas. The patient is not nervous/anxious.        Alcohol use-daily        Objective:    Physical Exam Constitutional:      General: He is not in acute distress.    Appearance: He is not ill-appearing.  Eyes:     Extraocular Movements: Extraocular movements intact.     Conjunctiva/sclera: Conjunctivae normal.  Cardiovascular:     Rate and Rhythm: Normal rate.  Pulmonary:     Effort: Pulmonary effort is normal.  Musculoskeletal:     Cervical back: Normal range of motion and neck supple.  Skin:    General: Skin is warm and dry.  Neurological:     General: No focal deficit present.     Mental Status: He is alert and oriented to person, place, and time.  Psychiatric:        Mood and Affect: Mood normal.        Behavior: Behavior normal.        Thought Content: Thought content normal.      BP (!) 138/92 (BP Location: Left Arm, Patient Position: Sitting, Cuff Size: Large)   Pulse 70   Temp 97.8 F (36.6 C) (Temporal)   Ht 6' (1.829 m)   Wt 192 lb (87.1 kg)   SpO2 99%   BMI 26.04 kg/m  Wt Readings from Last 3 Encounters:  06/25/23 192 lb (87.1 kg)  03/20/23 179 lb (81.2 kg)  02/06/23 176 lb (79.8 kg)       Assessment & Plan:   Problem List Items Addressed This Visit     B12 deficiency   Relevant Orders   Vitamin B12   Daily consumption of alcohol   Hypercholesterolemia with LDL greater than 190 mg/dL   Relevant Medications   atorvastatin (LIPITOR) 20 MG tablet   Other Relevant Orders   Lipid panel   Primary hypertension - Primary   Relevant Medications   atorvastatin (LIPITOR) 20 MG tablet   Other Relevant Orders   CBC with Differential/Platelet   Comprehensive metabolic panel   Thiamine deficiency   Relevant Orders   Vitamin B1   Other Visit Diagnoses     Non compliance w medication regimen       Fatigue, unspecified type       Relevant Orders   VITAMIN D 25 Hydroxy (Vit-D Deficiency, Fractures)   TSH   T4, free   Ferritin      Fortunately he is feeling better overall including mood.  Recommend better medication  compliance. Discussed potential long term health consequences of uncontrolled HTN, HLD and vitamin deficiencies. Check labs and refill medications as appropriate. Recommend monitoring BP at home and restarting medications.    I have changed  Robert Bruce's atorvastatin. I am also having him maintain his valsartan-hydrochlorothiazide, thiamine, cyanocobalamin, pantoprazole, potassium chloride, and nadolol. We will continue to administer sodium chloride.  Meds ordered this encounter  Medications   atorvastatin (LIPITOR) 20 MG tablet    Sig: Take 1 tablet (20 mg total) by mouth daily.    Dispense:  90 tablet    Refill:  1

## 2023-06-26 ENCOUNTER — Other Ambulatory Visit: Payer: Self-pay | Admitting: Family Medicine

## 2023-06-26 DIAGNOSIS — E559 Vitamin D deficiency, unspecified: Secondary | ICD-10-CM

## 2023-06-26 MED ORDER — VITAMIN D (ERGOCALCIFEROL) 1.25 MG (50000 UNIT) PO CAPS: 50000.0000 [IU] | ORAL_CAPSULE | ORAL | 2 refills | Status: DC

## 2023-06-26 NOTE — Progress Notes (Signed)
His vitamin D is severely low. I will send in a once weekly vitamin D supplement for him to take for the next 3 months. Also, he really should restart the cholesterol medication. His LDL is again higher than 190 which increases his risk for heart attack and stroke as we discussed. Otherwise, his blood work is ok. Still waiting on thiamine (vitamin B1) level.

## 2023-06-28 LAB — VITAMIN B1: Vitamin B1 (Thiamine): 8 nmol/L (ref 8–30)

## 2023-07-23 ENCOUNTER — Ambulatory Visit: Payer: Managed Care, Other (non HMO) | Admitting: Gastroenterology

## 2023-07-30 ENCOUNTER — Other Ambulatory Visit (INDEPENDENT_AMBULATORY_CARE_PROVIDER_SITE_OTHER): Payer: Managed Care, Other (non HMO)

## 2023-07-30 ENCOUNTER — Ambulatory Visit: Payer: Managed Care, Other (non HMO) | Admitting: Gastroenterology

## 2023-07-30 ENCOUNTER — Encounter: Payer: Self-pay | Admitting: Gastroenterology

## 2023-07-30 VITALS — BP 130/88 | HR 68 | Ht 73.0 in | Wt 194.0 lb

## 2023-07-30 DIAGNOSIS — R634 Abnormal weight loss: Secondary | ICD-10-CM | POA: Diagnosis not present

## 2023-07-30 DIAGNOSIS — R112 Nausea with vomiting, unspecified: Secondary | ICD-10-CM

## 2023-07-30 DIAGNOSIS — R197 Diarrhea, unspecified: Secondary | ICD-10-CM

## 2023-07-30 DIAGNOSIS — R195 Other fecal abnormalities: Secondary | ICD-10-CM | POA: Diagnosis not present

## 2023-07-30 DIAGNOSIS — R935 Abnormal findings on diagnostic imaging of other abdominal regions, including retroperitoneum: Secondary | ICD-10-CM

## 2023-07-30 LAB — COMPREHENSIVE METABOLIC PANEL
ALT: 18 U/L (ref 0–53)
AST: 18 U/L (ref 0–37)
Albumin: 5 g/dL (ref 3.5–5.2)
Alkaline Phosphatase: 47 U/L (ref 39–117)
BUN: 15 mg/dL (ref 6–23)
CO2: 28 meq/L (ref 19–32)
Calcium: 9.5 mg/dL (ref 8.4–10.5)
Chloride: 98 meq/L (ref 96–112)
Creatinine, Ser: 0.9 mg/dL (ref 0.40–1.50)
GFR: 102.4 mL/min (ref >60.00)
Glucose, Bld: 84 mg/dL (ref 70–99)
Potassium: 3.7 meq/L (ref 3.5–5.1)
Sodium: 138 meq/L (ref 135–145)
Total Bilirubin: 0.3 mg/dL (ref 0.2–1.2)
Total Protein: 7.9 g/dL (ref 6.0–8.3)

## 2023-07-30 LAB — CBC WITH DIFFERENTIAL/PLATELET
Basophils Absolute: 0 10*3/uL (ref 0.0–0.1)
Basophils Relative: 0.5 % (ref 0.0–3.0)
Eosinophils Absolute: 0.3 10*3/uL (ref 0.0–0.7)
Eosinophils Relative: 3.7 % (ref 0.0–5.0)
HCT: 42.8 % (ref 39.0–52.0)
Hemoglobin: 14.2 g/dL (ref 13.0–17.0)
Lymphocytes Relative: 40.3 % (ref 12.0–46.0)
Lymphs Abs: 2.8 10*3/uL (ref 0.7–4.0)
MCHC: 33.2 g/dL (ref 30.0–36.0)
MCV: 89.4 fL (ref 78.0–100.0)
Monocytes Absolute: 0.6 10*3/uL (ref 0.1–1.0)
Monocytes Relative: 8.4 % (ref 3.0–12.0)
Neutro Abs: 3.3 10*3/uL (ref 1.4–7.7)
Neutrophils Relative %: 47.1 % (ref 43.0–77.0)
Platelets: 259 10*3/uL (ref 150.0–400.0)
RBC: 4.79 Mil/uL (ref 4.22–5.81)
RDW: 14.1 % (ref 11.5–15.5)
WBC: 7 10*3/uL (ref 4.0–10.5)

## 2023-07-30 LAB — C-REACTIVE PROTEIN: CRP: <1 mg/dL (ref 0.5–20.0)

## 2023-07-30 NOTE — Progress Notes (Signed)
 Chief Complaint: Abnormal CT scan Primary GI MD: Dr. Charlanne  HPI: 48 year old male with medical history as listed below presents for evaluation of abnormal CT scan.  Patient states in July he was seen by PCP for a 20 pound weight loss over the span of 15 days with associated nausea, vomiting, diarrhea.  For a 20 pound weight loss over the span of 15 days with associated nausea, vomiting, diarrhea.  States he was not eating properly and he was under immense amount of stress which she contributed to his symptoms.  His PCP ordered a CT for further evaluation  CT abdomen pelvis with contrast shows submucosal fibrofatty infiltration of the ascending colon extending through the mid transverse colon and mild submucosal fibrofatty infiltration of the terminal ileum (findings can be seen in the setting of IBD).  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  Follow-up 1 year.  Patient states since the extrinsic stressor has resolved his symptoms have also resolved.  No longer having abnormal stools.  He has gained the weight back and is back at 194.  Denies any GI issues today and states overall he is doing well normal bowel movements without melena or hematochezia.  Denies family history of colon cancer.  Reassuringly patient did have a colonoscopy earlier this year for screening done (June 2024) and this was normal with no evidence of inflammation  PREVIOUS GI WORKUP   Colonoscopy 01/07/2023 for screening - One 4 mm polyp (hyperplastic polyp) in the distal rectum, removed with a cold snare. Resected and retrieved.  - Very minimal sigmoid diverticulosis.  - Non- bleeding internal hemorrhoids.  - The examined portion of the ileum was normal. - Repeat 12/2032  Past Medical History:  Diagnosis Date   Asthma    Hypertension    Prediabetes 06/25/2023    Past Surgical History:  Procedure Laterality Date   lymph node removal      Current Outpatient Medications  Medication  Sig Dispense Refill   atorvastatin  (LIPITOR) 20 MG tablet Take 1 tablet (20 mg total) by mouth daily. 90 tablet 1   cyanocobalamin  (VITAMIN B12) 500 MCG tablet Take 500 mcg by mouth daily.     nadolol  (CORGARD ) 20 MG tablet TAKE 1 TABLET(20 MG) BY MOUTH DAILY 90 tablet 1   pantoprazole  (PROTONIX ) 40 MG tablet Take 1 tablet (40 mg total) by mouth daily. 90 tablet 0   potassium chloride  (KLOR-CON  M) 10 MEQ tablet Take 1 tablet (10 mEq total) by mouth daily. 30 tablet 1   thiamine  (VITAMIN B1) 100 MG tablet Take 1 tablet (100 mg total) by mouth daily. 90 tablet 0   valsartan -hydrochlorothiazide  (DIOVAN -HCT) 160-12.5 MG tablet Take 1 tablet by mouth daily. 90 tablet 1   Vitamin D , Ergocalciferol , (DRISDOL ) 1.25 MG (50000 UNIT) CAPS capsule Take 1 capsule (50,000 Units total) by mouth every 7 (seven) days. 4 capsule 2   Current Facility-Administered Medications  Medication Dose Route Frequency Provider Last Rate Last Admin   0.9 %  sodium chloride  infusion  500 mL Intravenous Continuous Charlanne Groom, MD        Allergies as of 07/30/2023   (No Known Allergies)    Family History  Problem Relation Age of Onset   Hyperlipidemia Mother    Hypertension Brother    Kidney disease Maternal Grandmother    Colon cancer Neg Hx    Esophageal cancer Neg Hx     Social History   Socioeconomic History   Marital status:  Married    Spouse name: Not on file   Number of children: Not on file   Years of education: Not on file   Highest education level: Not on file  Occupational History   Not on file  Tobacco Use   Smoking status: Some Days    Current packs/day: 0.25    Types: Cigarettes   Smokeless tobacco: Never  Vaping Use   Vaping status: Never Used  Substance and Sexual Activity   Alcohol use: Yes    Alcohol/week: 9.0 standard drinks of alcohol    Types: 9 Shots of liquor per week    Comment: most days   Drug use: No   Sexual activity: Not on file  Other Topics Concern   Not on file   Social History Narrative   Not on file   Social Drivers of Health   Financial Resource Strain: Not on file  Food Insecurity: Not on file  Transportation Needs: Not on file  Physical Activity: Not on file  Stress: Not on file  Social Connections: Not on file  Intimate Partner Violence: Not on file    Review of Systems:    Constitutional: No weight loss, fever, chills, weakness or fatigue HEENT: Eyes: No change in vision               Ears, Nose, Throat:  No change in hearing or congestion Skin: No rash or itching Cardiovascular: No chest pain, chest pressure or palpitations   Respiratory: No SOB or cough Gastrointestinal: See HPI and otherwise negative Genitourinary: No dysuria or change in urinary frequency Neurological: No headache, dizziness or syncope Musculoskeletal: No new muscle or joint pain Hematologic: No bleeding or bruising Psychiatric: No history of depression or anxiety    Physical Exam:  Vital signs: BP 130/88   Pulse 68   Ht 6' 1 (1.854 m)   Wt 194 lb (88 kg)   BMI 25.60 kg/m   Constitutional: NAD, Well developed, Well nourished, alert and cooperative Head:  Normocephalic and atraumatic. Eyes:   PEERL, EOMI. No icterus. Conjunctiva pink. Respiratory: Respirations even and unlabored. Lungs clear to auscultation bilaterally.   No wheezes, crackles, or rhonchi.  Cardiovascular:  Regular rate and rhythm. No peripheral edema, cyanosis or pallor.  Gastrointestinal:  Soft, nondistended, nontender. No rebound or guarding. Normal bowel sounds. No appreciable masses or hepatomegaly. Rectal:  Not performed.  Msk:  Symmetrical without gross deformities. Without edema, no deformity or joint abnormality.  Neurologic:  Alert and  oriented x4;  grossly normal neurologically.  Skin:   Dry and intact without significant lesions or rashes. Psychiatric: Oriented to person, place and time. Demonstrates good judgement and reason without abnormal affect or  behaviors.   RELEVANT LABS AND IMAGING: CBC    Component Value Date/Time   WBC 5.9 06/25/2023 1026   RBC 4.60 06/25/2023 1026   HGB 14.0 06/25/2023 1026   HGB 14.2 08/29/2017 1732   HCT 42.6 06/25/2023 1026   HCT 41.4 08/29/2017 1732   PLT 299.0 06/25/2023 1026   PLT 273 08/29/2017 1732   MCV 92.6 06/25/2023 1026   MCV 88.0 05/30/2018 1150   MCV 85 08/29/2017 1732   MCH 30.2 05/30/2018 1150   MCH 29.3 04/15/2017 0842   MCHC 32.8 06/25/2023 1026   RDW 14.3 06/25/2023 1026   RDW 15.0 08/29/2017 1732   LYMPHSABS 2.0 06/25/2023 1026   MONOABS 0.5 06/25/2023 1026   EOSABS 0.2 06/25/2023 1026   BASOSABS 0.0 06/25/2023 1026  CMP     Component Value Date/Time   NA 140 06/25/2023 1026   NA 141 10/30/2019 0928   K 4.3 06/25/2023 1026   CL 104 06/25/2023 1026   CO2 28 06/25/2023 1026   GLUCOSE 82 06/25/2023 1026   BUN 10 06/25/2023 1026   BUN 11 10/30/2019 0928   CREATININE 0.86 06/25/2023 1026   CREATININE 0.85 01/10/2015 1217   CALCIUM  9.5 06/25/2023 1026   PROT 7.8 06/25/2023 1026   PROT 8.0 10/30/2019 0928   ALBUMIN 4.6 06/25/2023 1026   ALBUMIN 5.2 (H) 10/30/2019 0928   AST 17 06/25/2023 1026   ALT 13 06/25/2023 1026   ALKPHOS 43 06/25/2023 1026   BILITOT 0.4 06/25/2023 1026   BILITOT 0.4 10/30/2019 0928   GFRNONAA 100 10/30/2019 0928   GFRAA 115 10/30/2019 0928     Assessment/Plan:   Abnormal CT of the abdomen Nausea and vomiting, unspecified vomiting type Loose stools Loss of weight Episode of nausea, vomiting, loose stools, unintentional 20 pound weight loss with CT scan showing fibrofatty infiltration of ascending colon through mid transverse as well as terminal ileum concerning for IBD.  Patient had resolution of symptoms after extrinsic stressors resolved.  He also gained weight back.  With no further symptoms and obvious identifiable trigger with his symptoms as well as colonoscopy done 2 months prior to CT scan this provides adequate reassurance that  patient likely does not have IBD. - CBC, CMP, CRP, fecal calprotectin for further evaluation - No indication for repeat colonoscopy with resolution of symptoms and up-to-date colonoscopy - Repeat CT scan for further evaluation - Reassurance - If symptoms return plan is let us  know, otherwise follow-up as needed or based on results  Lawsen Arnott Moll[facility] 07/30/2023, 3:14 PM  Cc: Henson, Vickie L, NP-C

## 2023-07-30 NOTE — Patient Instructions (Addendum)
 Your provider has requested that you go to the basement level for lab work before leaving today. Press B on the elevator. The lab is located at the first door on the left as you exit the elevator.  Due to recent changes in healthcare laws, you may see the results of your imaging and laboratory studies on MyChart before your provider has had a chance to review them.  We understand that in some cases there may be results that are confusing or concerning to you. Not all laboratory results come back in the same time frame and the provider may be waiting for multiple results in order to interpret others.  Please give us  48 hours in order for your provider to thoroughly review all the results before contacting the office for clarification of your results.   You have been scheduled for a CT scan of the abdomen and pelvis at [facility], 1st floor Radiology. You are scheduled on 08/06/2023 at 4:00pm. You should arrive at 1:45pm for the  appointment time for registration and prepping.   If you have any questions regarding your exam or if you need to reschedule, you may call [facility] at [phone] between the hours of 8:00 am and 5:00 pm, Monday-Friday.    I appreciate the opportunity to care for you. Nestor Blower, PA-C

## 2023-08-02 ENCOUNTER — Other Ambulatory Visit: Payer: Managed Care, Other (non HMO)

## 2023-08-02 DIAGNOSIS — R935 Abnormal findings on diagnostic imaging of other abdominal regions, including retroperitoneum: Secondary | ICD-10-CM

## 2023-08-02 DIAGNOSIS — R195 Other fecal abnormalities: Secondary | ICD-10-CM

## 2023-08-02 DIAGNOSIS — R634 Abnormal weight loss: Secondary | ICD-10-CM

## 2023-08-06 ENCOUNTER — Ambulatory Visit (HOSPITAL_COMMUNITY)
Admission: RE | Admit: 2023-08-06 | Discharge: 2023-08-06 | Disposition: A | Discharge status: Home / Self Care | Payer: Managed Care, Other (non HMO) | Source: Ambulatory Visit | Attending: Gastroenterology | Admitting: Gastroenterology

## 2023-08-06 DIAGNOSIS — R935 Abnormal findings on diagnostic imaging of other abdominal regions, including retroperitoneum: Secondary | ICD-10-CM | POA: Diagnosis present

## 2023-08-06 DIAGNOSIS — R112 Nausea with vomiting, unspecified: Secondary | ICD-10-CM | POA: Insufficient documentation

## 2023-08-06 DIAGNOSIS — R634 Abnormal weight loss: Secondary | ICD-10-CM | POA: Diagnosis present

## 2023-08-06 MED ORDER — IOHEXOL 300 MG/ML  SOLN
100.0000 mL | Freq: Once | INTRAMUSCULAR | Status: AC | PRN
  Administered 2023-08-06: 100 mL via INTRAVENOUS

## 2023-08-06 MED ORDER — IOHEXOL 300 MG/ML  SOLN
30.0000 mL | Freq: Once | INTRAMUSCULAR | Status: AC | PRN
  Administered 2023-08-06: 30 mL via ORAL

## 2023-08-07 LAB — CALPROTECTIN, FECAL: Calprotectin, Fecal: 102 ug/g (ref 0–120)

## 2023-08-26 ENCOUNTER — Telehealth: Payer: Self-pay | Admitting: Gastroenterology

## 2023-08-26 ENCOUNTER — Other Ambulatory Visit: Payer: Self-pay | Admitting: *Deleted

## 2023-08-26 ENCOUNTER — Encounter: Payer: Self-pay | Admitting: *Deleted

## 2023-08-26 DIAGNOSIS — Z1211 Encounter for screening for malignant neoplasm of colon: Secondary | ICD-10-CM

## 2023-08-26 DIAGNOSIS — R112 Nausea with vomiting, unspecified: Secondary | ICD-10-CM

## 2023-08-26 DIAGNOSIS — R109 Unspecified abdominal pain: Secondary | ICD-10-CM

## 2023-08-26 DIAGNOSIS — R935 Abnormal findings on diagnostic imaging of other abdominal regions, including retroperitoneum: Secondary | ICD-10-CM

## 2023-08-26 DIAGNOSIS — R634 Abnormal weight loss: Secondary | ICD-10-CM

## 2023-08-26 MED ORDER — SUFLAVE 178.7 G PO SOLR: 1.0000 | Freq: Once | ORAL | 0 refills | Status: AC

## 2023-08-26 NOTE — Telephone Encounter (Signed)
See 08/06/23 CT result note for additional details.

## 2023-08-26 NOTE — Telephone Encounter (Signed)
Inbound call from patient returning phone call regarding recent imaging results. Please advise, thank you.  

## 2023-08-28 ENCOUNTER — Ambulatory Visit: Payer: Managed Care, Other (non HMO) | Admitting: Family Medicine

## 2023-08-28 ENCOUNTER — Encounter: Payer: Self-pay | Admitting: Family Medicine

## 2023-08-28 VITALS — BP 108/74 | HR 69 | Temp 98.3°F | Ht 73.0 in | Wt 185.6 lb

## 2023-08-28 DIAGNOSIS — K219 Gastro-esophageal reflux disease without esophagitis: Secondary | ICD-10-CM

## 2023-08-28 DIAGNOSIS — I10 Essential (primary) hypertension: Secondary | ICD-10-CM

## 2023-08-28 DIAGNOSIS — R109 Unspecified abdominal pain: Secondary | ICD-10-CM

## 2023-08-28 DIAGNOSIS — R112 Nausea with vomiting, unspecified: Secondary | ICD-10-CM

## 2023-08-28 DIAGNOSIS — R935 Abnormal findings on diagnostic imaging of other abdominal regions, including retroperitoneum: Secondary | ICD-10-CM

## 2023-08-28 DIAGNOSIS — E278 Other specified disorders of adrenal gland: Secondary | ICD-10-CM | POA: Diagnosis not present

## 2023-08-28 MED ORDER — PANTOPRAZOLE SODIUM 40 MG PO TBEC: 40.0000 mg | DELAYED_RELEASE_TABLET | Freq: Every day | ORAL | 0 refills | Status: DC

## 2023-08-28 NOTE — Progress Notes (Signed)
 Subjective:     Patient ID: Robert Bruce, male    DOB: [birth year], 48 y.o.   MRN: [id number]  Chief Complaint  Patient presents with   Flank Pain    Right side pain for the past 2 weeks. No injury patient is aware of to cause pain. Notes of no past injury/surgery within are of concern. Pain has been intermittent, but flares up during cough or when exerting force at work. Currently treating with tylenol    HPI   History of Present Illness         C/o a 2 wk hx of right side/ RLQ pain that started while stretching.   Does not hurt at work unless he stretches his arms over his head or moves in a certain way.   He has taken Tylenol which helps.   He also reports drinking alcohol yesterday and shortly afterwards, he felt sick and vomited. This has been occurring fairly often. Denies hematemesis.    Health Maintenance Due  Topic Date Due   Pneumococcal Vaccine 107-53 Years old (1 of 2 - PCV) Never done   DTaP/Tdap/Td (1 - Tdap) Never done   COVID-19 Vaccine (1 - 2024-25 season) Never done    Past Medical History:  Diagnosis Date   Asthma    Hypertension    Prediabetes 06/25/2023    Past Surgical History:  Procedure Laterality Date   lymph node removal      Family History  Problem Relation Age of Onset   Hyperlipidemia Mother    Hypertension Brother    Kidney disease Maternal Grandmother    Colon cancer Neg Hx    Esophageal cancer Neg Hx     Social History   Socioeconomic History   Marital status: Married    Spouse name: Not on file   Number of children: Not on file   Years of education: Not on file   Highest education level: Not on file  Occupational History   Not on file  Tobacco Use   Smoking status: Some Days    Current packs/day: 0.25    Types: Cigarettes   Smokeless tobacco: Never  Vaping Use   Vaping status: Never Used  Substance and Sexual Activity   Alcohol use: Yes    Alcohol/week: 9.0 standard drinks of alcohol    Types: 9 Shots  of liquor per week    Comment: most days   Drug use: No   Sexual activity: Not on file  Other Topics Concern   Not on file  Social History Narrative   Not on file   Social Drivers of Health   Financial Resource Strain: Not on file  Food Insecurity: Not on file  Transportation Needs: Not on file  Physical Activity: Not on file  Stress: Not on file  Social Connections: Not on file  Intimate Partner Violence: Not on file    Outpatient Medications Prior to Visit  Medication Sig Dispense Refill   atorvastatin  (LIPITOR) 20 MG tablet Take 1 tablet (20 mg total) by mouth daily. 90 tablet 1   cyanocobalamin  (VITAMIN B12) 500 MCG tablet Take 500 mcg by mouth daily.     nadolol  (CORGARD ) 20 MG tablet TAKE 1 TABLET(20 MG) BY MOUTH DAILY 90 tablet 1   potassium chloride  (KLOR-CON  M) 10 MEQ tablet Take 1 tablet (10 mEq total) by mouth daily. 30 tablet 1   thiamine  (VITAMIN B1) 100 MG tablet Take 1 tablet (100 mg total) by mouth daily. 90 tablet 0  valsartan -hydrochlorothiazide  (DIOVAN -HCT) 160-12.5 MG tablet Take 1 tablet by mouth daily. 90 tablet 1   Vitamin D , Ergocalciferol , (DRISDOL ) 1.25 MG (50000 UNIT) CAPS capsule Take 1 capsule (50,000 Units total) by mouth every 7 (seven) days. 4 capsule 2   pantoprazole  (PROTONIX ) 40 MG tablet Take 1 tablet (40 mg total) by mouth daily. 90 tablet 0   Facility-Administered Medications Prior to Visit  Medication Dose Route Frequency Provider Last Rate Last Admin   0.9 %  sodium chloride  infusion  500 mL Intravenous Continuous Charlanne Groom, MD        No Known Allergies  Review of Systems  Constitutional:  Negative for chills, fever, malaise/fatigue and weight loss.  Respiratory:  Negative for cough and shortness of breath.   Cardiovascular:  Negative for chest pain, palpitations and leg swelling.  Gastrointestinal:  Positive for abdominal pain, nausea and vomiting. Negative for constipation and diarrhea.  Genitourinary:  Negative for dysuria,  frequency and urgency.  Musculoskeletal:  Positive for myalgias. Negative for back pain.  Skin:  Negative for itching and rash.  Neurological:  Negative for dizziness, focal weakness and headaches.  Endo/Heme/Allergies:  Does not bruise/bleed easily.  Psychiatric/Behavioral:  Negative for depression and suicidal ideas. The patient is not nervous/anxious.        Objective:    Physical Exam Constitutional:      General: He is not in acute distress.    Appearance: He is not ill-appearing.  HENT:     Mouth/Throat:     Mouth: Mucous membranes are moist.     Pharynx: Oropharynx is clear.  Eyes:     Extraocular Movements: Extraocular movements intact.     Conjunctiva/sclera: Conjunctivae normal.  Cardiovascular:     Rate and Rhythm: Normal rate and regular rhythm.  Pulmonary:     Effort: Pulmonary effort is normal.     Breath sounds: Normal breath sounds.  Abdominal:     General: Bowel sounds are normal.     Palpations: Abdomen is soft.     Tenderness: There is no abdominal tenderness. There is no guarding or rebound. Negative signs include Murphy's sign and McBurney's sign.     Comments: Right lower side TTP and pain with movement  Musculoskeletal:     Cervical back: Normal range of motion and neck supple.     Right lower leg: No edema.     Left lower leg: No edema.  Skin:    General: Skin is warm and dry.  Neurological:     General: No focal deficit present.     Mental Status: He is alert and oriented to person, place, and time.     Motor: No weakness.     Coordination: Coordination normal.     Gait: Gait normal.  Psychiatric:        Mood and Affect: Mood normal.        Behavior: Behavior normal.        Thought Content: Thought content normal.      BP 108/74   Pulse 69   Temp 98.3 F (36.8 C)   Ht 6' 1 (1.854 m)   Wt 185 lb 9.6 oz (84.2 kg)   SpO2 99%   BMI 24.49 kg/m  Wt Readings from Last 3 Encounters:  08/28/23 185 lb 9.6 oz (84.2 kg)  07/30/23 194 lb (88  kg)  06/25/23 192 lb (87.1 kg)       Assessment & Plan:   Problem List Items Addressed This Visit  Abnormal CT of the abdomen   Gastroesophageal reflux disease   Relevant Medications   pantoprazole  (PROTONIX ) 40 MG tablet   Left adrenal mass (HCC)   Primary hypertension   Other Visit Diagnoses       Right sided abdominal pain    -  Primary     Nausea and vomiting, unspecified vomiting type          Discussed that his current symptoms appear to be related to musculoskeletal etiology due to pain with certain movements.  We discussed Tylenol, heating pad or ice pack and topical analgesic.  This should gradually improve.  Advised him to follow-up if he has any new or worsening symptoms. He has been dealing with ongoing nausea and vomiting with his last episode yesterday.  Denies hematemesis.  He is aware that alcohol intake is making this worse.  Advised him to restart pantoprazole , refilled medication.  He has an appointment with GI for a colonoscopy in April.  I will reach out to his GI to let them know about the fairly persistent nausea and vomiting in case they think an EGD would also be warranted. Advised him to call and schedule with endocrinology regarding adrenal mass. Encouraged him to cut back and stop alcohol use.  I am having Robert Bruce maintain his valsartan -hydrochlorothiazide , thiamine , cyanocobalamin , potassium chloride , nadolol , atorvastatin , Vitamin D  (Ergocalciferol ), and pantoprazole . We will continue to administer sodium chloride .  Meds ordered this encounter  Medications   pantoprazole  (PROTONIX ) 40 MG tablet    Sig: Take 1 tablet (40 mg total) by mouth daily.    Dispense:  90 tablet    Refill:  0    Supervising Provider:   ROLLENE NORRIS A [[id number]]

## 2023-08-28 NOTE — Patient Instructions (Addendum)
 Its seems that you have pulled a muscle and that is the explanation for your side pain.   Use a heating pad or ice pack. Take Tylenol 1,000 mg once or twice daily.   If the pain becomes more constant or you have any new symptoms, let me know.   Continue taking Protonix  or restart if you are out. I refilled this medication.   Avoid alcohol   Please reach out to schedule or see if you have been there for the adrenal nodule.   [facility] [address] [place] [address] [phone]

## 2023-08-29 NOTE — Telephone Encounter (Signed)
 Spoke to patient regarding adding endoscopy to his already scheduled colonoscopy. We will do this but need to change the date/time of procedure to account for 2 procedure slots. Patient has been rescheduled to endoscopy/colonoscopy on 10/30/23, 130 pm arrival. New instructions updated in [place]. Patient verbalizes understanding.

## 2023-08-29 NOTE — Telephone Encounter (Signed)
-----   Message from Nestor CHRISTELLA Blower sent at 08/28/2023  2:36 PM EST ----- Patient's PCP reached out to me as she saw him today for nausea and vomiting.  She gave him Protonix .  She questioned whether or not he needs EGD in addition to his colonoscopy.  I feel like this is reasonable.  Please add EGD to patient's colonoscopy with Dx code nausea and vomiting

## 2023-09-04 ENCOUNTER — Other Ambulatory Visit: Payer: Self-pay | Admitting: Family Medicine

## 2023-09-04 DIAGNOSIS — E559 Vitamin D deficiency, unspecified: Secondary | ICD-10-CM

## 2023-10-18 ENCOUNTER — Other Ambulatory Visit: Payer: Self-pay | Admitting: Family Medicine

## 2023-10-18 DIAGNOSIS — I10 Essential (primary) hypertension: Secondary | ICD-10-CM

## 2023-10-24 ENCOUNTER — Ambulatory Visit: Payer: Managed Care, Other (non HMO) | Admitting: Family Medicine

## 2023-10-28 ENCOUNTER — Encounter: Payer: Managed Care, Other (non HMO) | Admitting: Gastroenterology

## 2023-10-30 ENCOUNTER — Encounter: Payer: Managed Care, Other (non HMO) | Admitting: Gastroenterology

## 2024-02-27 ENCOUNTER — Encounter: Payer: Self-pay | Admitting: Family Medicine

## 2024-02-27 ENCOUNTER — Ambulatory Visit (INDEPENDENT_AMBULATORY_CARE_PROVIDER_SITE_OTHER): Admitting: Family Medicine

## 2024-02-27 VITALS — BP 122/70 | HR 68 | Temp 97.8°F | Ht 73.0 in | Wt 174.0 lb

## 2024-02-27 DIAGNOSIS — E559 Vitamin D deficiency, unspecified: Secondary | ICD-10-CM | POA: Diagnosis not present

## 2024-02-27 DIAGNOSIS — E519 Thiamine deficiency, unspecified: Secondary | ICD-10-CM | POA: Diagnosis not present

## 2024-02-27 DIAGNOSIS — Z0001 Encounter for general adult medical examination with abnormal findings: Secondary | ICD-10-CM | POA: Diagnosis not present

## 2024-02-27 DIAGNOSIS — Z789 Other specified health status: Secondary | ICD-10-CM

## 2024-02-27 DIAGNOSIS — I10 Essential (primary) hypertension: Secondary | ICD-10-CM | POA: Diagnosis not present

## 2024-02-27 DIAGNOSIS — R5383 Other fatigue: Secondary | ICD-10-CM | POA: Diagnosis not present

## 2024-02-27 DIAGNOSIS — E538 Deficiency of other specified B group vitamins: Secondary | ICD-10-CM

## 2024-02-27 DIAGNOSIS — F321 Major depressive disorder, single episode, moderate: Secondary | ICD-10-CM

## 2024-02-27 DIAGNOSIS — Z125 Encounter for screening for malignant neoplasm of prostate: Secondary | ICD-10-CM

## 2024-02-27 DIAGNOSIS — E7800 Pure hypercholesterolemia, unspecified: Secondary | ICD-10-CM | POA: Diagnosis not present

## 2024-02-27 DIAGNOSIS — F17200 Nicotine dependence, unspecified, uncomplicated: Secondary | ICD-10-CM

## 2024-02-27 LAB — PSA: PSA: 0.28 ng/mL (ref 0.10–4.00)

## 2024-02-27 LAB — T4, FREE: Free T4: 0.83 ng/dL (ref 0.60–1.60)

## 2024-02-27 LAB — CBC WITH DIFFERENTIAL/PLATELET
Basophils Absolute: 0.1 K/uL (ref 0.0–0.1)
Basophils Relative: 0.9 % (ref 0.0–3.0)
Eosinophils Absolute: 0.2 K/uL (ref 0.0–0.7)
Eosinophils Relative: 3.3 % (ref 0.0–5.0)
HCT: 40.6 % (ref 39.0–52.0)
Hemoglobin: 13.7 g/dL (ref 13.0–17.0)
Lymphocytes Relative: 36.7 % (ref 12.0–46.0)
Lymphs Abs: 2.2 K/uL (ref 0.7–4.0)
MCHC: 33.8 g/dL (ref 30.0–36.0)
MCV: 88.9 fl (ref 78.0–100.0)
Monocytes Absolute: 0.5 K/uL (ref 0.1–1.0)
Monocytes Relative: 8.9 % (ref 3.0–12.0)
Neutro Abs: 3.1 K/uL (ref 1.4–7.7)
Neutrophils Relative %: 50.2 % (ref 43.0–77.0)
Platelets: 287 K/uL (ref 150.0–400.0)
RBC: 4.57 Mil/uL (ref 4.22–5.81)
RDW: 15.8 % — ABNORMAL HIGH (ref 11.5–15.5)
WBC: 6.1 K/uL (ref 4.0–10.5)

## 2024-02-27 LAB — VITAMIN B12: Vitamin B-12: 1011 pg/mL — ABNORMAL HIGH (ref 211–911)

## 2024-02-27 LAB — VITAMIN D 25 HYDROXY (VIT D DEFICIENCY, FRACTURES): VITD: 14.89 ng/mL — ABNORMAL LOW (ref 30.00–100.00)

## 2024-02-27 LAB — TSH: TSH: 1.5 u[IU]/mL (ref 0.35–5.50)

## 2024-02-27 MED ORDER — ATORVASTATIN CALCIUM 20 MG PO TABS: 20.0000 mg | ORAL_TABLET | Freq: Every day | ORAL | 1 refills | Status: DC

## 2024-02-27 MED ORDER — NALTREXONE HCL 50 MG PO TABS
50.0000 mg | ORAL_TABLET | Freq: Every day | ORAL | 5 refills | Status: DC
Start: 2024-02-27 — End: 2024-06-12

## 2024-02-27 NOTE — Progress Notes (Signed)
 Complete physical exam  Patient: Legion Discher   DOB: 1976/02/28   48 y.o. Male  MRN: [id number]  Subjective:    Chief Complaint  Patient presents with   Annual Exam    Discussed the use of AI scribe software for clinical note transcription with the patient, who gave verbal consent to proceed.  History of Present Illness Dru Hildred Mollica is a 48 year old male who presents for a check-up after recent life changes and increased alcohol use. Due for CPE.   Psychological distress and sleep disturbance - Significant emotional distress following discovery of spouse's affair and recent relocation from [place]  - Poor appetite and sleep disturbances since moving back on Monday - Increased evening alcohol consumption to aid sleep and cope with emotional pain - Expresses desire to discontinue alcohol use  - Fatigue and depressive symptoms present - Recent weight loss - Plans to resume virtual therapy sessions next week  Alcohol use - Increased alcohol consumption in the evenings to manage sleep and emotional distress - Desires to stop alcohol use   Headache and hydration - Headaches, likely stress-related - Hydration limited to a couple of 12-ounce bottles of water daily  Tobacco use - Smokes cigarettes  Cardiovascular risk and medication adherence - Takes two antihypertensive medications - he is not taking cholesterol medication - Recent LDL level of 194 mg/dL - No chest pain, dizziness, or vomiting  Panic attacks - History of panic attacks, last episode approximately one month ago     Health Maintenance  Topic Date Due   DTaP/Tdap/Td vaccine (1 - Tdap) Never done   Pneumococcal Vaccine for high risk medical condition (1 of 2 - PCV) Never done   Hepatitis B Vaccine (1 of 3 - 19+ 3-dose series) Never done   Flu Shot  02/21/2024   COVID-19 Vaccine (1 - 2024-25 season) 03/14/2024*   Colon Cancer Screening  01/06/2033   Hepatitis C Screening  Completed    HIV Screening  Completed   HPV Vaccine  Aged Out   Meningitis B Vaccine  Aged Out  *Topic was postponed. The date shown is not the original due date.    Wears seatbelt always, uses sunscreen, smoke detectors in home and functioning, does not text while driving, feels safe in home environment.  Depression screening:    02/27/2024    2:38 PM 06/25/2023    9:16 AM 03/20/2023    9:29 AM  Depression screen PHQ 2/9  Decreased Interest 2 0 1  Down, Depressed, Hopeless 2 0 2  PHQ - 2 Score 4 0 3  Altered sleeping 3 0 2  Tired, decreased energy 1 0 1  Change in appetite 3 0 3  Feeling bad or failure about yourself  3 0 2  Trouble concentrating 1 0 1  Moving slowly or fidgety/restless 1 0 1  Suicidal thoughts 0 0 0  PHQ-9 Score 16 0 13  Difficult doing work/chores Somewhat difficult     Anxiety Screening:    02/27/2024    2:38 PM 03/20/2023    9:29 AM 10/30/2019    8:19 AM  GAD 7 : Generalized Anxiety Score  Nervous, Anxious, on Edge 3 1 0  Control/stop worrying 3 2 0  Worry too much - different things 3 3 0  Trouble relaxing 2 1 0  Restless 2 0 0  Easily annoyed or irritable 1 2 0  Afraid - awful might happen 1 1 0  Total GAD 7 Score 15 10  0  Anxiety Difficulty Somewhat difficult Somewhat difficult Not difficult at all        Patient Care Team: Lendia Boby CROME, NP-C as PCP - General (Family Medicine)   Outpatient Medications Prior to Visit  Medication Sig   cyanocobalamin  (VITAMIN B12) 500 MCG tablet Take 500 mcg by mouth daily.   nadolol  (CORGARD ) 20 MG tablet TAKE 1 TABLET(20 MG) BY MOUTH DAILY   thiamine  (VITAMIN B1) 100 MG tablet Take 1 tablet (100 mg total) by mouth daily.   valsartan -hydrochlorothiazide  (DIOVAN -HCT) 160-12.5 MG tablet TAKE 1 TABLET BY MOUTH DAILY   Vitamin D , Ergocalciferol , (DRISDOL ) 1.25 MG (50000 UNIT) CAPS capsule Take 1 capsule (50,000 Units total) by mouth every 7 (seven) days.   pantoprazole  (PROTONIX ) 40 MG tablet Take 1 tablet (40 mg total)  by mouth daily. (Patient not taking: Reported on 02/27/2024)   potassium chloride  (KLOR-CON  M) 10 MEQ tablet Take 1 tablet (10 mEq total) by mouth daily. (Patient not taking: Reported on 02/27/2024)   [DISCONTINUED] atorvastatin  (LIPITOR) 20 MG tablet Take 1 tablet (20 mg total) by mouth daily. (Patient not taking: Reported on 02/27/2024)   Facility-Administered Medications Prior to Visit  Medication Dose Route Frequency Provider   0.9 %  sodium chloride  infusion  500 mL Intravenous Continuous Charlanne Groom, MD    Review of Systems  Constitutional:  Positive for malaise/fatigue and weight loss. Negative for chills and fever.  HENT:  Negative for congestion, ear pain, sinus pain and sore throat.   Eyes:  Negative for blurred vision, double vision and pain.  Respiratory:  Negative for cough, shortness of breath and wheezing.   Cardiovascular:  Negative for chest pain, palpitations and leg swelling.  Gastrointestinal:  Positive for nausea. Negative for abdominal pain, constipation, diarrhea and vomiting.  Genitourinary:  Negative for dysuria, frequency and urgency.  Musculoskeletal:  Negative for back pain, joint pain and myalgias.  Skin:  Negative for rash.  Neurological:  Negative for dizziness, tingling, focal weakness and headaches.  Psychiatric/Behavioral:  Positive for depression. Negative for memory loss. The patient is nervous/anxious and has insomnia.        Objective:    BP 122/70   Pulse 68   Temp 97.8 F (36.6 C) (Temporal)   Ht 6' 1 (1.854 m)   Wt 174 lb (78.9 kg)   SpO2 98%   BMI 22.96 kg/m  BP Readings from Last 3 Encounters:  02/27/24 122/70  08/28/23 108/74  07/30/23 130/88   Wt Readings from Last 3 Encounters:  02/27/24 174 lb (78.9 kg)  08/28/23 185 lb 9.6 oz (84.2 kg)  07/30/23 194 lb (88 kg)    Physical Exam Constitutional:      General: He is not in acute distress.    Appearance: He is not ill-appearing.  HENT:     Right Ear: Tympanic membrane, ear  canal and external ear normal.     Left Ear: Tympanic membrane, ear canal and external ear normal.     Nose: Nose normal.     Mouth/Throat:     Mouth: Mucous membranes are moist.     Pharynx: Oropharynx is clear.  Eyes:     Extraocular Movements: Extraocular movements intact.     Conjunctiva/sclera: Conjunctivae normal.     Pupils: Pupils are equal, round, and reactive to light.  Neck:     Thyroid : No thyroid  mass, thyromegaly or thyroid  tenderness.  Cardiovascular:     Rate and Rhythm: Normal rate and regular rhythm.  Pulses: Normal pulses.     Heart sounds: Normal heart sounds.  Pulmonary:     Effort: Pulmonary effort is normal.     Breath sounds: Normal breath sounds.  Abdominal:     General: Bowel sounds are normal. There is no distension.     Palpations: Abdomen is soft.     Tenderness: There is no abdominal tenderness. There is no right CVA tenderness, left CVA tenderness, guarding or rebound.  Musculoskeletal:        General: Normal range of motion.     Cervical back: Normal range of motion and neck supple. No tenderness.     Right lower leg: No edema.     Left lower leg: No edema.  Lymphadenopathy:     Cervical: No cervical adenopathy.  Skin:    General: Skin is warm and dry.     Findings: No lesion or rash.  Neurological:     General: No focal deficit present.     Mental Status: He is alert and oriented to person, place, and time.     Cranial Nerves: No cranial nerve deficit.     Sensory: No sensory deficit.     Motor: No weakness.  Psychiatric:        Mood and Affect: Mood normal.        Behavior: Behavior normal.        Thought Content: Thought content normal.      No results found for any visits on 02/27/24.    Assessment & Plan:    Routine Health Maintenance and Physical Exam  Problem List Items Addressed This Visit     B12 deficiency   Relevant Orders   Vitamin B12   Daily consumption of alcohol   Hypercholesterolemia with LDL greater than  190 mg/dL   Relevant Medications   atorvastatin  (LIPITOR) 20 MG tablet   Moderate major depression (HCC)   Primary hypertension   Relevant Medications   atorvastatin  (LIPITOR) 20 MG tablet   Other Relevant Orders   CBC with Differential/Platelet   Comprehensive metabolic panel with GFR   TSH   T4, free   Smoker   Thiamine  deficiency   Relevant Orders   Vitamin B1   Other Visit Diagnoses       Encounter for general adult medical examination with abnormal findings    -  Primary     Fatigue, unspecified type       Relevant Orders   CBC with Differential/Platelet   Comprehensive metabolic panel with GFR   TSH   T4, free     Vitamin D  deficiency       Relevant Orders   VITAMIN D  25 Hydroxy (Vit-D Deficiency, Fractures)     Screening for prostate cancer       Relevant Orders   PSA      Assessment and Plan Assessment & Plan Annual exam Preventive health care reviewed.  Counseling on healthy lifestyle including diet and exercise.  Recommend regular dental and eye exams.  Immunizations reviewed.  Discussed safety. PSA ordered.  Emotional distress due to personal issues has led to poor eating and sleeping habits. He is taking his blood pressure medications and vitamins B1 and B12 but has stopped taking atorvastatin . Reports increased alcohol consumption to cope with stress and sleep issues. He has lost weight and experiences headaches, likely due to stress. Hydrating inadequately and smoking cigarettes. No current vomiting, chest pain, or dizziness reported. Panic attacks have subsided. - Order lab tests to assess overall  health status. - Encourage resumption of atorvastatin  for hyperlipidemia management. - Advise increasing water intake to 5-6 bottles of 12 oz per day. - Discuss with therapist about potential referral to psychiatry for alcohol use disorder management.  Depression with alcohol use disorder Experiencing depression exacerbated by recent personal issues, leading to  increased alcohol consumption primarily in the evenings to aid sleep. Expresses willingness to try medication to reduce alcohol use. Has a therapist in [place] and plans to resume virtual sessions next week.  - Discuss with therapist about potential referral to psychiatry  -discussed case with Dr. Joshua. Start naltrexone . Counseling on potential side effects and to stop medication if he needs opioids for pain in the future.    Primary hypertension Hypertension is being managed with two blood pressure medications, which he is taking regularly.  Hyperlipidemia Stopped taking atorvastatin , resulting in an LDL level of 194, significantly increasing risk for heart disease, heart attack, and stroke. Does not have atorvastatin  at home and needs a refill. - Refill atorvastatin  prescription. - Plan to recheck cholesterol levels in two months after resuming atorvastatin .  Nicotine dependence, cigarettes Reports smoking cigarettes.  Headache Experiencing headaches, likely related to stress and inadequate hydration. - Advise increasing water intake to 5-6 bottles of 12 oz per day.  Unintentional weight loss and fatigue Reports unintentional weight loss and fatigue, likely related to stress and poor eating habits due to recent personal issues. - Order lab tests to assess overall health status.    Return in about 5 weeks (around 04/02/2024) for chronic health conditions.     Boby Mackintosh, NP-C

## 2024-02-28 LAB — COMPREHENSIVE METABOLIC PANEL WITH GFR
ALT: 11 U/L (ref 0–53)
AST: 16 U/L (ref 0–37)
Albumin: 4.8 g/dL (ref 3.5–5.2)
Alkaline Phosphatase: 44 U/L (ref 39–117)
BUN: 12 mg/dL (ref 6–23)
CO2: 22 meq/L (ref 19–32)
Calcium: 9.6 mg/dL (ref 8.4–10.5)
Chloride: 96 meq/L (ref 96–112)
Creatinine, Ser: 0.84 mg/dL (ref 0.40–1.50)
GFR: 104.13 mL/min (ref >60.00)
Glucose, Bld: 73 mg/dL (ref 70–99)
Potassium: 3.6 meq/L (ref 3.5–5.1)
Sodium: 141 meq/L (ref 135–145)
Total Bilirubin: 0.5 mg/dL (ref 0.2–1.2)
Total Protein: 7.4 g/dL (ref 6.0–8.3)

## 2024-03-02 ENCOUNTER — Ambulatory Visit: Payer: Self-pay | Admitting: Family Medicine

## 2024-03-02 ENCOUNTER — Other Ambulatory Visit: Payer: Self-pay | Admitting: Family Medicine

## 2024-03-02 DIAGNOSIS — E559 Vitamin D deficiency, unspecified: Secondary | ICD-10-CM

## 2024-03-02 MED ORDER — VITAMIN D (ERGOCALCIFEROL) 1.25 MG (50000 UNIT) PO CAPS: 50000.0000 [IU] | ORAL_CAPSULE | ORAL | 1 refills | Status: DC

## 2024-03-03 LAB — VITAMIN B1: Vitamin B1 (Thiamine): 24 nmol/L (ref 8–30)

## 2024-04-02 ENCOUNTER — Ambulatory Visit: Admitting: Family Medicine

## 2024-04-03 ENCOUNTER — Ambulatory Visit: Payer: Self-pay | Admitting: Family Medicine

## 2024-04-03 ENCOUNTER — Encounter: Payer: Self-pay | Admitting: Family Medicine

## 2024-04-03 ENCOUNTER — Ambulatory Visit: Admitting: Family Medicine

## 2024-04-03 VITALS — BP 104/72 | HR 55 | Temp 97.6°F | Ht 73.0 in | Wt 167.0 lb

## 2024-04-03 DIAGNOSIS — R634 Abnormal weight loss: Secondary | ICD-10-CM | POA: Diagnosis not present

## 2024-04-03 DIAGNOSIS — E519 Thiamine deficiency, unspecified: Secondary | ICD-10-CM | POA: Diagnosis not present

## 2024-04-03 DIAGNOSIS — I700 Atherosclerosis of aorta: Secondary | ICD-10-CM | POA: Diagnosis not present

## 2024-04-03 DIAGNOSIS — E559 Vitamin D deficiency, unspecified: Secondary | ICD-10-CM

## 2024-04-03 DIAGNOSIS — E7800 Pure hypercholesterolemia, unspecified: Secondary | ICD-10-CM

## 2024-04-03 DIAGNOSIS — I10 Essential (primary) hypertension: Secondary | ICD-10-CM | POA: Diagnosis not present

## 2024-04-03 DIAGNOSIS — E538 Deficiency of other specified B group vitamins: Secondary | ICD-10-CM

## 2024-04-03 LAB — COMPREHENSIVE METABOLIC PANEL WITH GFR
ALT: 22 U/L (ref 0–53)
AST: 24 U/L (ref 0–37)
Albumin: 5 g/dL (ref 3.5–5.2)
Alkaline Phosphatase: 40 U/L (ref 39–117)
BUN: 9 mg/dL (ref 6–23)
CO2: 30 meq/L (ref 19–32)
Calcium: 10.2 mg/dL (ref 8.4–10.5)
Chloride: 99 meq/L (ref 96–112)
Creatinine, Ser: 0.82 mg/dL (ref 0.40–1.50)
GFR: 104.82 mL/min (ref >60.00)
Glucose, Bld: 89 mg/dL (ref 70–99)
Potassium: 3.8 meq/L (ref 3.5–5.1)
Sodium: 139 meq/L (ref 135–145)
Total Bilirubin: 0.8 mg/dL (ref 0.2–1.2)
Total Protein: 8 g/dL (ref 6.0–8.3)

## 2024-04-03 LAB — CBC WITH DIFFERENTIAL/PLATELET
Basophils Absolute: 0 K/uL (ref 0.0–0.1)
Basophils Relative: 0.6 % (ref 0.0–3.0)
Eosinophils Absolute: 0.3 K/uL (ref 0.0–0.7)
Eosinophils Relative: 5.1 % — ABNORMAL HIGH (ref 0.0–5.0)
HCT: 42.4 % (ref 39.0–52.0)
Hemoglobin: 13.9 g/dL (ref 13.0–17.0)
Lymphocytes Relative: 33.2 % (ref 12.0–46.0)
Lymphs Abs: 2.1 K/uL (ref 0.7–4.0)
MCHC: 32.8 g/dL (ref 30.0–36.0)
MCV: 89.7 fl (ref 78.0–100.0)
Monocytes Absolute: 0.5 K/uL (ref 0.1–1.0)
Monocytes Relative: 7.5 % (ref 3.0–12.0)
Neutro Abs: 3.4 K/uL (ref 1.4–7.7)
Neutrophils Relative %: 53.6 % (ref 43.0–77.0)
Platelets: 286 K/uL (ref 150.0–400.0)
RBC: 4.73 Mil/uL (ref 4.22–5.81)
RDW: 16.6 % — ABNORMAL HIGH (ref 11.5–15.5)
WBC: 6.4 K/uL (ref 4.0–10.5)

## 2024-04-03 LAB — URINALYSIS, ROUTINE W REFLEX MICROSCOPIC
Bilirubin Urine: NEGATIVE
Hgb urine dipstick: NEGATIVE
Ketones, ur: NEGATIVE
Leukocytes,Ua: NEGATIVE
Nitrite: NEGATIVE
RBC / HPF: NONE SEEN (ref 0–?)
Specific Gravity, Urine: 1.015 (ref 1.000–1.030)
Total Protein, Urine: NEGATIVE
Urine Glucose: NEGATIVE
Urobilinogen, UA: 1 (ref 0.0–1.0)
WBC, UA: NONE SEEN (ref 0–?)
pH: 7 (ref 5.0–8.0)

## 2024-04-03 LAB — LIPID PANEL
Cholesterol: 177 mg/dL (ref 0–200)
HDL: 55.9 mg/dL (ref >39.00)
LDL Cholesterol: 104 mg/dL — ABNORMAL HIGH (ref 0–99)
NonHDL: 120.98
Total CHOL/HDL Ratio: 3
Triglycerides: 83 mg/dL (ref 0.0–149.0)
VLDL: 16.6 mg/dL (ref 0.0–40.0)

## 2024-04-03 LAB — T4, FREE: Free T4: 0.77 ng/dL (ref 0.60–1.60)

## 2024-04-03 LAB — FERRITIN: Ferritin: 198.7 ng/mL (ref 22.0–322.0)

## 2024-04-03 LAB — TSH: TSH: 1.17 u[IU]/mL (ref 0.35–5.50)

## 2024-04-03 LAB — HEMOGLOBIN A1C: Hgb A1c MFr Bld: 6.2 % (ref 4.6–6.5)

## 2024-04-03 NOTE — Progress Notes (Signed)
 Subjective:     Patient ID: Robert Bruce, male    DOB: 1976/02/18, 48 y.o.   MRN: [id number]  Chief Complaint  Patient presents with   Medical Management of Chronic Issues    5 week f/u, states he did get back on atorvastatin .     HPI  Discussed the use of AI scribe software for clinical note transcription with the patient, who gave verbal consent to proceed.  History of Present Illness Robert Bruce is a 48 year old male who presents for follow-up on chronic health conditions.  Medication adherence - Consistently takes prescribed cholesterol medication. - Has not been taking prescribed vitamin D ; has not obtained it from the pharmacy. - Takes over-the-counter B12 and B1 supplements.  Alcohol use and craving management - Working on reducing alcohol intake, primarily drinking only on weekends. - Discussed with therapist the need to take naltrexone  more consistently to manage cravings. - Therapy sessions have been beneficial, with the last session two weeks ago.  Appetite and weight loss - Decreased appetite with associated weight loss. - Current weight in the 160s, which is lower than usual. - No appetite.  Dizziness and headaches - Occasional dizziness, particularly when rising quickly from a kneeling position, associated with dehydration. - Headaches have improved with increased fluid intake and now occur only during periods of stress or worry.  Mood symptoms and antidepressant use - Previously took citalopram  for mood but discontinued due to side effects, believed to be exacerbated by alcohol use.     Health Maintenance Due  Topic Date Due   DTaP/Tdap/Td (1 - Tdap) Never done   Pneumococcal Vaccine (1 of 2 - PCV) Never done   Hepatitis B Vaccines 19-59 Average Risk (1 of 3 - 19+ 3-dose series) Never done    Past Medical History:  Diagnosis Date   Asthma    Hypertension    Prediabetes 06/25/2023    Past Surgical History:  Procedure  Laterality Date   lymph node removal      Family History  Problem Relation Age of Onset   Hyperlipidemia Mother    Hypertension Brother    Kidney disease Maternal Grandmother    Colon cancer Neg Hx    Esophageal cancer Neg Hx     Social History   Socioeconomic History   Marital status: Married    Spouse name: Not on file   Number of children: Not on file   Years of education: Not on file   Highest education level: Not on file  Occupational History   Not on file  Tobacco Use   Smoking status: Some Days    Current packs/day: 0.25    Types: Cigarettes   Smokeless tobacco: Never  Vaping Use   Vaping status: Never Used  Substance and Sexual Activity   Alcohol use: Yes    Alcohol/week: 9.0 standard drinks of alcohol    Types: 9 Shots of liquor per week    Comment: most days   Drug use: No   Sexual activity: Not on file  Other Topics Concern   Not on file  Social History Narrative   Not on file   Social Drivers of Health   Financial Resource Strain: Not on file  Food Insecurity: Not on file  Transportation Needs: Not on file  Physical Activity: Not on file  Stress: Not on file  Social Connections: Not on file  Intimate Partner Violence: Not on file    Outpatient Medications Prior to Visit  Medication Sig Dispense Refill   atorvastatin  (LIPITOR) 20 MG tablet Take 1 tablet (20 mg total) by mouth daily. 90 tablet 1   cyanocobalamin  (VITAMIN B12) 500 MCG tablet Take 500 mcg by mouth daily.     nadolol  (CORGARD ) 20 MG tablet TAKE 1 TABLET(20 MG) BY MOUTH DAILY 90 tablet 1   naltrexone  (DEPADE) 50 MG tablet Take 1 tablet (50 mg total) by mouth daily. 30 tablet 5   thiamine  (VITAMIN B1) 100 MG tablet Take 1 tablet (100 mg total) by mouth daily. 90 tablet 0   valsartan -hydrochlorothiazide  (DIOVAN -HCT) 160-12.5 MG tablet TAKE 1 TABLET BY MOUTH DAILY 90 tablet 1   pantoprazole  (PROTONIX ) 40 MG tablet Take 1 tablet (40 mg total) by mouth daily. (Patient not taking: Reported  on 04/03/2024) 90 tablet 0   potassium chloride  (KLOR-CON  M) 10 MEQ tablet Take 1 tablet (10 mEq total) by mouth daily. (Patient not taking: Reported on 04/03/2024) 30 tablet 1   Vitamin D , Ergocalciferol , (DRISDOL ) 1.25 MG (50000 UNIT) CAPS capsule Take 1 capsule (50,000 Units total) by mouth every 7 (seven) days. (Patient not taking: Reported on 04/03/2024) 6 capsule 1   Facility-Administered Medications Prior to Visit  Medication Dose Route Frequency Provider Last Rate Last Admin   0.9 %  sodium chloride  infusion  500 mL Intravenous Continuous Charlanne Groom, MD        No Known Allergies  ROS See HPI    Objective:    Physical Exam Constitutional:      General: He is not in acute distress.    Appearance: He is not ill-appearing.  Eyes:     Extraocular Movements: Extraocular movements intact.     Conjunctiva/sclera: Conjunctivae normal.  Cardiovascular:     Rate and Rhythm: Normal rate.  Pulmonary:     Effort: Pulmonary effort is normal.  Musculoskeletal:     Cervical back: Normal range of motion and neck supple.  Skin:    General: Skin is warm and dry.  Neurological:     General: No focal deficit present.     Mental Status: He is alert and oriented to person, place, and time.     Cranial Nerves: No cranial nerve deficit.     Motor: No weakness.     Coordination: Coordination normal.  Psychiatric:        Mood and Affect: Mood normal.        Behavior: Behavior normal.        Thought Content: Thought content normal.      BP 104/72   Pulse (!) 55   Temp 97.6 F (36.4 C) (Temporal)   Ht 6' 1 (1.854 m)   Wt 167 lb (75.8 kg)   SpO2 99%   BMI 22.03 kg/m  Wt Readings from Last 3 Encounters:  04/03/24 167 lb (75.8 kg)  02/27/24 174 lb (78.9 kg)  08/28/23 185 lb 9.6 oz (84.2 kg)       Assessment & Plan:   Problem List Items Addressed This Visit     Aortic atherosclerosis (HCC)   Relevant Orders   Lipid panel (Completed)   B12 deficiency   Relevant Orders    Ferritin (Completed)   Hypercholesterolemia with LDL greater than 190 mg/dL - Primary   Relevant Orders   Lipid panel (Completed)   Primary hypertension   Relevant Orders   CBC with Differential/Platelet (Completed)   Comprehensive metabolic panel with GFR (Completed)   TSH (Completed)   T4, free (Completed)   Thiamine  deficiency  Other Visit Diagnoses       Vitamin D  deficiency         Weight loss       Relevant Orders   CBC with Differential/Platelet (Completed)   Comprehensive metabolic panel with GFR (Completed)   Ferritin (Completed)   TSH (Completed)   T4, free (Completed)   Urinalysis, Routine w reflex microscopic (Completed)   Hemoglobin A1c (Completed)   Prealbumin       Assessment and Plan Assessment & Plan Alcohol use disorder Alcohol use disorder with efforts to reduce consumption, currently drinking only on weekends. Naltrexone  discussed to reduce cravings and pleasure associated with alcohol consumption. - Encourage regular use of naltrexone  as advised by therapist  Unintentional weight loss Continued unintentional weight loss likely due to poor appetite and eating habits. Weight is lower than expected, raising concern about nutritional intake. Dizziness upon standing quickly, likely due to dehydration. - Encourage intake of protein shakes or nutritional supplements to increase calorie and protein intake - Advise increasing water intake  Vitamin D  deficiency Vitamin D  deficiency with non-adherence to prescribed ergocalciferol . Prescription sent to pharmacy but not picked up, contributing to feeling unwell. - Ensure prescription for ergocalciferol  is picked up from pharmacy - Send MyChart message if there are issues obtaining the prescription  Pure hypercholesterolemia and atherosclerosis of aorta Hypercholesterolemia with atherosclerosis of the aorta. Cholesterol medication resumed. - Check fasting cholesterol levels  Primary hypertension Blood  pressure is well-controlled on current medication regimen.  Depression Moderate major depression with fluctuating symptoms. Therapy is beneficial. Previous citalopram  use was not well-tolerated, possibly due to concurrent alcohol use. Currently not on antidepressants, therapist advised against starting medication at this time. - Consider future use of antidepressants if alcohol use is further reduced and symptoms persist     I am having Robert Bruce maintain his thiamine , cyanocobalamin , potassium chloride , nadolol , pantoprazole , valsartan -hydrochlorothiazide , naltrexone , atorvastatin , and Vitamin D  (Ergocalciferol ). We will continue to administer sodium chloride .  No orders of the defined types were placed in this encounter.

## 2024-04-03 NOTE — Patient Instructions (Addendum)
 Please go downstairs for labs and a urine test  Increase your calories and protein.  You can add a protein shake once or twice daily.  Try to get more fruits and vegetables.  Stay hydrated  Take the naltrexone   Let me know if you need another vitamin D  prescription sent to your pharmacy  Follow-up in 6 weeks

## 2024-04-06 LAB — PREALBUMIN: Prealbumin: 35 mg/dL (ref 21–43)

## 2024-04-11 ENCOUNTER — Other Ambulatory Visit: Payer: Self-pay | Admitting: Family Medicine

## 2024-05-15 ENCOUNTER — Ambulatory Visit (INDEPENDENT_AMBULATORY_CARE_PROVIDER_SITE_OTHER): Admitting: Family Medicine

## 2024-05-15 ENCOUNTER — Encounter: Payer: Self-pay | Admitting: Family Medicine

## 2024-05-15 VITALS — BP 108/72 | HR 60 | Temp 97.8°F | Ht 73.0 in | Wt 160.0 lb

## 2024-05-15 DIAGNOSIS — E559 Vitamin D deficiency, unspecified: Secondary | ICD-10-CM | POA: Diagnosis not present

## 2024-05-15 DIAGNOSIS — E7800 Pure hypercholesterolemia, unspecified: Secondary | ICD-10-CM

## 2024-05-15 DIAGNOSIS — R42 Dizziness and giddiness: Secondary | ICD-10-CM | POA: Diagnosis not present

## 2024-05-15 DIAGNOSIS — R634 Abnormal weight loss: Secondary | ICD-10-CM

## 2024-05-15 DIAGNOSIS — Z789 Other specified health status: Secondary | ICD-10-CM

## 2024-05-15 DIAGNOSIS — E538 Deficiency of other specified B group vitamins: Secondary | ICD-10-CM | POA: Diagnosis not present

## 2024-05-15 DIAGNOSIS — E639 Nutritional deficiency, unspecified: Secondary | ICD-10-CM

## 2024-05-15 DIAGNOSIS — I10 Essential (primary) hypertension: Secondary | ICD-10-CM

## 2024-05-15 DIAGNOSIS — F321 Major depressive disorder, single episode, moderate: Secondary | ICD-10-CM | POA: Diagnosis not present

## 2024-05-15 DIAGNOSIS — R9431 Abnormal electrocardiogram [ECG] [EKG]: Secondary | ICD-10-CM | POA: Diagnosis not present

## 2024-05-15 MED ORDER — DULOXETINE HCL 30 MG PO CPEP: 30.0000 mg | ORAL_CAPSULE | Freq: Every day | ORAL | 1 refills | Status: DC

## 2024-05-15 MED ORDER — VALSARTAN-HYDROCHLOROTHIAZIDE 80-12.5 MG PO TABS: 1.0000 | ORAL_TABLET | Freq: Every day | ORAL | 3 refills | Status: DC

## 2024-05-15 MED ORDER — ESCITALOPRAM OXALATE 5 MG PO TABS: 5.0000 mg | ORAL_TABLET | Freq: Every day | ORAL | 1 refills | Status: DC

## 2024-05-15 NOTE — Progress Notes (Signed)
 Subjective:     Patient ID: Robert Bruce, male    DOB: 1975/11/25, 48 y.o.   MRN: [id number]  Chief Complaint  Patient presents with   Medical Management of Chronic Issues    6 week f/u    HPI  Discussed the use of AI scribe software for clinical note transcription with the patient, who gave verbal consent to proceed.  History of Present Illness Robert Bruce is a 48 year old male who presents for follow-up.  Unintentional weight loss and nutritional status - Lost seven pounds over the past six weeks - Current BMI is 21 - Attempts to increase nutrition with protein shakes, but intake is inconsistent - Concerned about weight loss and its health implications  Metabolic abnormalities - Hyperlipidemia and prediabetes - Recent laboratory results: A1c 6.2%, LDL 104 (improved from previous values) - Tolerates statin medication without adverse effects  Dizziness and headache - Experiences dizziness, particularly when standing quickly - Occasional headaches - Possible contributing factors include dehydration or alcohol use  Alcohol and substance use - Attempts to limit alcohol intake to two shots per day, with some alcohol-free days - Not taking prescribed naltrexone  - Occasional use of CBD for calmness  Psychological stress and mood disturbance - Significant stress related to recent divorce - Attends therapy sessions biweekly - Feels overwhelmed and irritable - Occasional thoughts of self-harm without plan or intent       04/03/2024    9:23 AM 02/27/2024    2:38 PM 06/25/2023    9:16 AM 03/20/2023    9:29 AM 02/06/2023    8:33 AM  Depression screen PHQ 2/9  Decreased Interest 1 2 0 1 0  Down, Depressed, Hopeless 2 2 0 2 0  PHQ - 2 Score 3 4 0 3 0  Altered sleeping 1 3 0 2 0  Tired, decreased energy 2 1 0 1 0  Change in appetite 3 3 0 3 0  Feeling bad or failure about yourself  2 3 0 2 0  Trouble concentrating 1 1 0 1 0  Moving slowly or  fidgety/restless 1 1 0 1 0  Suicidal thoughts 0 0 0 0 0  PHQ-9 Score 13 16 0 13 0  Difficult doing work/chores Somewhat difficult Somewhat difficult         Health Maintenance Due  Topic Date Due   DTaP/Tdap/Td (1 - Tdap) Never done   Pneumococcal Vaccine (1 of 2 - PCV) Never done   Hepatitis B Vaccines 19-59 Average Risk (1 of 3 - 19+ 3-dose series) Never done    Past Medical History:  Diagnosis Date   Asthma    Hypertension    Prediabetes 06/25/2023    Past Surgical History:  Procedure Laterality Date   lymph node removal      Family History  Problem Relation Age of Onset   Hyperlipidemia Mother    Hypertension Brother    Kidney disease Maternal Grandmother    Colon cancer Neg Hx    Esophageal cancer Neg Hx     Social History   Socioeconomic History   Marital status: Married    Spouse name: Not on file   Number of children: Not on file   Years of education: Not on file   Highest education level: Not on file  Occupational History   Not on file  Tobacco Use   Smoking status: Some Days    Current packs/day: 0.25    Types: Cigarettes   Smokeless tobacco:  Never  Vaping Use   Vaping status: Never Used  Substance and Sexual Activity   Alcohol use: Yes    Alcohol/week: 9.0 standard drinks of alcohol    Types: 9 Shots of liquor per week    Comment: most days   Drug use: No   Sexual activity: Not on file  Other Topics Concern   Not on file  Social History Narrative   Not on file   Social Drivers of Health   Financial Resource Strain: Not on file  Food Insecurity: Not on file  Transportation Needs: Not on file  Physical Activity: Not on file  Stress: Not on file  Social Connections: Not on file  Intimate Partner Violence: Not on file    Outpatient Medications Prior to Visit  Medication Sig Dispense Refill   atorvastatin  (LIPITOR) 20 MG tablet Take 1 tablet (20 mg total) by mouth daily. 90 tablet 1   cyanocobalamin  (VITAMIN B12) 500 MCG tablet Take  500 mcg by mouth daily.     nadolol  (CORGARD ) 20 MG tablet TAKE 1 TABLET(20 MG) BY MOUTH DAILY 90 tablet 1   naltrexone  (DEPADE) 50 MG tablet Take 1 tablet (50 mg total) by mouth daily. 30 tablet 5   pantoprazole  (PROTONIX ) 40 MG tablet Take 1 tablet (40 mg total) by mouth daily. 90 tablet 0   thiamine  (VITAMIN B1) 100 MG tablet Take 1 tablet (100 mg total) by mouth daily. 90 tablet 0   VITAMIN D  PO Take by mouth.     valsartan -hydrochlorothiazide  (DIOVAN -HCT) 160-12.5 MG tablet TAKE 1 TABLET BY MOUTH DAILY 90 tablet 1   potassium chloride  (KLOR-CON  M) 10 MEQ tablet Take 1 tablet (10 mEq total) by mouth daily. (Patient not taking: Reported on 05/15/2024) 30 tablet 1   Vitamin D , Ergocalciferol , (DRISDOL ) 1.25 MG (50000 UNIT) CAPS capsule Take 1 capsule (50,000 Units total) by mouth every 7 (seven) days. (Patient not taking: Reported on 05/15/2024) 6 capsule 1   Facility-Administered Medications Prior to Visit  Medication Dose Route Frequency Provider Last Rate Last Admin   0.9 %  sodium chloride  infusion  500 mL Intravenous Continuous Charlanne Groom, MD        No Known Allergies  Review of Systems  Constitutional:  Positive for malaise/fatigue and weight loss. Negative for chills and fever.  Respiratory:  Negative for shortness of breath.   Cardiovascular:  Negative for chest pain, palpitations and leg swelling.  Gastrointestinal:  Negative for abdominal pain, constipation, diarrhea, nausea and vomiting.  Genitourinary:  Negative for dysuria, frequency and urgency.  Neurological:  Positive for dizziness. Negative for focal weakness.  Psychiatric/Behavioral:  Positive for depression, substance abuse and suicidal ideas. The patient is nervous/anxious.        Objective:    Physical Exam Constitutional:      General: He is not in acute distress.    Appearance: He is not ill-appearing.  HENT:     Mouth/Throat:     Mouth: Mucous membranes are moist.     Pharynx: Oropharynx is clear.   Eyes:     Extraocular Movements: Extraocular movements intact.     Conjunctiva/sclera: Conjunctivae normal.     Pupils: Pupils are equal, round, and reactive to light.  Cardiovascular:     Rate and Rhythm: Normal rate and regular rhythm.  Pulmonary:     Effort: Pulmonary effort is normal.     Breath sounds: Normal breath sounds.  Musculoskeletal:     Cervical back: Normal range of motion and neck  supple. No tenderness.     Right lower leg: No edema.     Left lower leg: No edema.  Lymphadenopathy:     Cervical: No cervical adenopathy.  Skin:    General: Skin is warm and dry.  Neurological:     General: No focal deficit present.     Mental Status: He is alert and oriented to person, place, and time.     Motor: No weakness.     Gait: Gait normal.  Psychiatric:        Attention and Perception: Attention normal.        Mood and Affect: Mood is depressed. Affect is tearful.        Speech: Speech normal.        Behavior: Behavior normal.        Thought Content: Thought content is not delusional. Thought content includes suicidal ideation. Thought content does not include homicidal ideation. Thought content does not include homicidal or suicidal plan.     Comments: Passive SI      BP 108/72   Pulse 60   Temp 97.8 F (36.6 C) (Temporal)   Ht 6' 1 (1.854 m)   Wt 160 lb (72.6 kg)   SpO2 98%   BMI 21.11 kg/m  Wt Readings from Last 3 Encounters:  05/15/24 160 lb (72.6 kg)  04/03/24 167 lb (75.8 kg)  02/27/24 174 lb (78.9 kg)       Assessment & Plan:   Problem List Items Addressed This Visit     B12 deficiency   Daily consumption of alcohol   Hypercholesterolemia with LDL greater than 190 mg/dL   Relevant Medications   valsartan -hydrochlorothiazide  (DIOVAN -HCT) 80-12.5 MG tablet   Moderate major depression (HCC) - Primary   Relevant Medications   DULoxetine (CYMBALTA) 30 MG capsule   Poor diet   Primary hypertension   Relevant Medications    valsartan -hydrochlorothiazide  (DIOVAN -HCT) 80-12.5 MG tablet   Other Visit Diagnoses       Dizziness       Relevant Orders   EKG 12-Lead     Vitamin D  deficiency         Prolonged Q-T interval on ECG       Relevant Orders   EKG 12-Lead     Weight loss           Assessment and Plan Assessment & Plan Chronic weight loss Continued weight loss with a BMI of 21, down 7 pounds in the past 6 weeks. Nutritional deficiencies previously checked and were normal. Concern about weight loss persists. - Encourage increased caloric intake, including protein shakes  Hypercholesterolemia LDL improved to 104 mg/dL from 805 mg/dL. Tolerating atorvastatin  well. - Continue atorvastatin  20 mg oral daily  Primary hypertension Blood pressure well controlled on current regimen. Recent readings show low blood pressure, possibly contributing to dizziness. Discussed potential adjustment of antihypertensive medication to alleviate dizziness. - Reduce valsartan  dose to 80 mg  Alcohol use disorder Currently consuming up to two shots of alcohol per day. Not taking naltrexone  as prescribed. Some days without alcohol, but still struggling with cravings. Discussed the impact of alcohol on health and the importance of reducing intake. - Encourage reduction in alcohol intake - Discussed the importance of taking naltrexone  50 mg oral daily  Major depressive disorder with suicidal ideation Experiencing depression, irritability, and suicidal thoughts without a specific plan. Currently seeing a therapist bi-weekly. Discussed SSRI is not recommended with history of prolonged QT syndrome. I will start a SNRI instead.  Provided information on urgent care access for mental health support. - Start duloxetine with caution due to history of prolonged QT syndrome - Provide address for [facility] for urgent care access if needed  Long QT syndrome EKG to be updated to assess current status before starting new  medication. Discussed the need to monitor QT interval due to potential risks associated with new medication. - Order EKG to assess QT interval before starting duloxetine - EKG today shows sinus bradycardia with sinus arrhythmia, rate 49, QT/QTc 430/388     I have discontinued Elwood L. Dilley's valsartan -hydrochlorothiazide , Vitamin D  (Ergocalciferol ), and escitalopram. I am also having him start on DULoxetine and valsartan -hydrochlorothiazide . Additionally, I am having him maintain his thiamine , cyanocobalamin , potassium chloride , pantoprazole , naltrexone , atorvastatin , nadolol , and VITAMIN D  PO. We will continue to administer sodium chloride .  Meds ordered this encounter  Medications   DISCONTD: escitalopram (LEXAPRO) 5 MG tablet    Sig: Take 1 tablet (5 mg total) by mouth at bedtime.    Dispense:  30 tablet    Refill:  1    Supervising Provider:   ROLLENE NORRIS A [[id number]]   DULoxetine (CYMBALTA) 30 MG capsule    Sig: Take 1 capsule (30 mg total) by mouth daily.    Dispense:  30 capsule    Refill:  1    Supervising Provider:   ROLLENE NORRIS A [[id number]]   valsartan -hydrochlorothiazide  (DIOVAN -HCT) 80-12.5 MG tablet    Sig: Take 1 tablet by mouth daily.    Dispense:  90 tablet    Refill:  3    Supervising Provider:   ROLLENE NORRIS A [[id number]]

## 2024-05-15 NOTE — Patient Instructions (Addendum)
 [facility]  [address] [place]    Start taking duloxetine (Cymbalta) daily.   Do not pick up escitalopram (Lexapro) at your pharmacy.  I canceled this medication.  Continue seeing your therapist.  I am reducing your valsartan  dose. Start valsartan -HCTZ 80-12.5 mg daily  This is to give you more blood pressure  Follow-up with me in 4 weeks

## 2024-06-12 ENCOUNTER — Ambulatory Visit: Payer: Self-pay | Admitting: Family Medicine

## 2024-06-12 ENCOUNTER — Encounter: Payer: Self-pay | Admitting: Family Medicine

## 2024-06-12 ENCOUNTER — Ambulatory Visit (INDEPENDENT_AMBULATORY_CARE_PROVIDER_SITE_OTHER): Admitting: Family Medicine

## 2024-06-12 ENCOUNTER — Ambulatory Visit (INDEPENDENT_AMBULATORY_CARE_PROVIDER_SITE_OTHER)

## 2024-06-12 VITALS — BP 118/84 | HR 64 | Temp 98.4°F | Ht 73.0 in | Wt 156.0 lb

## 2024-06-12 DIAGNOSIS — M25552 Pain in left hip: Secondary | ICD-10-CM

## 2024-06-12 DIAGNOSIS — I10 Essential (primary) hypertension: Secondary | ICD-10-CM | POA: Diagnosis not present

## 2024-06-12 DIAGNOSIS — R634 Abnormal weight loss: Secondary | ICD-10-CM

## 2024-06-12 DIAGNOSIS — F321 Major depressive disorder, single episode, moderate: Secondary | ICD-10-CM

## 2024-06-12 MED ORDER — KETOROLAC TROMETHAMINE 60 MG/2ML IM SOLN
60.0000 mg | Freq: Once | INTRAMUSCULAR | Status: AC
  Administered 2024-06-12: 60 mg via INTRAMUSCULAR

## 2024-06-12 MED ORDER — PREDNISONE 20 MG PO TABS: 20.0000 mg | ORAL_TABLET | Freq: Every day | ORAL | 0 refills | Status: DC

## 2024-06-12 NOTE — Progress Notes (Signed)
 Subjective:     Patient ID: Robert Bruce, male    DOB: [birth year], 48 y.o.   MRN: [id number]  Chief Complaint  Patient presents with   Follow-up    Pain in left leg sitting and standing for to long ongoing for 1 month    HPI  Discussed the use of AI scribe software for clinical note transcription with the patient, who gave verbal consent to proceed.  History of Present Illness Robert Bruce is a 48 year old male with depression and hypertension who presents with acute leg pain.  Left hip and groin pain - Intermittent pain in the left hip, localized to the groin area, recently progressed to constant and more severe pain - Pain exacerbated by sitting, standing, bending, and transitioning from sitting to standing - Severe pain last night disrupted sleep - No fever, chills, urinary symptoms, numbness, tingling, or weakness in the leg - Slight limp due to pain - No analgesic medication taken for pain  Depressive symptoms - Engaged in counseling for depression - Has not initiated duloxetine  due to concerns about interaction with occasional alcohol use - Mood improved with counseling, though occasional mood fluctuations persist  Hypertension management - Antihypertensive medication dose recently reduced - Increased energy since dose reduction - Blood pressure readings stable, with today's measurement at 118/84 mmHg - Compliant with medication regimen     Health Maintenance Due  Topic Date Due   DTaP/Tdap/Td (1 - Tdap) Never done   Pneumococcal Vaccine (1 of 2 - PCV) Never done   Hepatitis B Vaccines 19-59 Average Risk (1 of 3 - 19+ 3-dose series) Never done   COVID-19 Vaccine (1 - 2025-26 season) Never done    Past Medical History:  Diagnosis Date   Asthma    Hypertension    Prediabetes 06/25/2023    Past Surgical History:  Procedure Laterality Date   lymph node removal      Family History  Problem Relation Age of Onset   Hyperlipidemia Mother     Hypertension Brother    Kidney disease Maternal Grandmother    Colon cancer Neg Hx    Esophageal cancer Neg Hx     Social History   Socioeconomic History   Marital status: Married    Spouse name: Not on file   Number of children: Not on file   Years of education: Not on file   Highest education level: Not on file  Occupational History   Not on file  Tobacco Use   Smoking status: Some Days    Current packs/day: 0.25    Types: Cigarettes   Smokeless tobacco: Never  Vaping Use   Vaping status: Never Used  Substance and Sexual Activity   Alcohol use: Yes    Alcohol/week: 9.0 standard drinks of alcohol    Types: 9 Shots of liquor per week    Comment: most days   Drug use: No   Sexual activity: Not on file  Other Topics Concern   Not on file  Social History Narrative   Not on file   Social Drivers of Health   Financial Resource Strain: Not on file  Food Insecurity: Not on file  Transportation Needs: Not on file  Physical Activity: Not on file  Stress: Not on file  Social Connections: Not on file  Intimate Partner Violence: Not on file    Outpatient Medications Prior to Visit  Medication Sig Dispense Refill   atorvastatin  (LIPITOR) 20 MG tablet Take 1 tablet (20  mg total) by mouth daily. 90 tablet 1   cyanocobalamin  (VITAMIN B12) 500 MCG tablet Take 500 mcg by mouth daily.     DULoxetine  (CYMBALTA ) 30 MG capsule Take 1 capsule (30 mg total) by mouth daily. 30 capsule 1   nadolol  (CORGARD ) 20 MG tablet TAKE 1 TABLET(20 MG) BY MOUTH DAILY 90 tablet 1   pantoprazole  (PROTONIX ) 40 MG tablet Take 1 tablet (40 mg total) by mouth daily. 90 tablet 0   potassium chloride  (KLOR-CON  M) 10 MEQ tablet Take 1 tablet (10 mEq total) by mouth daily. (Patient not taking: Reported on 05/15/2024) 30 tablet 1   thiamine  (VITAMIN B1) 100 MG tablet Take 1 tablet (100 mg total) by mouth daily. 90 tablet 0   valsartan -hydrochlorothiazide  (DIOVAN -HCT) 80-12.5 MG tablet Take 1 tablet by mouth  daily. 90 tablet 3   VITAMIN D  PO Take by mouth.     naltrexone  (DEPADE) 50 MG tablet Take 1 tablet (50 mg total) by mouth daily. 30 tablet 5   Facility-Administered Medications Prior to Visit  Medication Dose Route Frequency Provider Last Rate Last Admin   0.9 %  sodium chloride  infusion  500 mL Intravenous Continuous Charlanne Groom, MD        No Known Allergies  Review of Systems  Constitutional:  Positive for weight loss. Negative for chills and fever.  Respiratory:  Negative for shortness of breath.   Cardiovascular:  Negative for chest pain, palpitations and leg swelling.  Gastrointestinal:  Negative for abdominal pain, constipation, diarrhea, nausea and vomiting.  Genitourinary:  Negative for dysuria, frequency, hematuria and urgency.  Musculoskeletal:  Positive for joint pain. Negative for back pain.       Left anterior hip pain   Neurological:  Negative for dizziness, tingling, focal weakness and headaches.  Psychiatric/Behavioral:  Positive for depression. Negative for suicidal ideas. The patient is not nervous/anxious.        Objective:    Physical Exam Constitutional:      General: He is not in acute distress.    Appearance: He is not ill-appearing.  HENT:     Mouth/Throat:     Mouth: Mucous membranes are moist.     Pharynx: Oropharynx is clear.  Eyes:     Extraocular Movements: Extraocular movements intact.     Conjunctiva/sclera: Conjunctivae normal.  Cardiovascular:     Rate and Rhythm: Normal rate.  Pulmonary:     Effort: Pulmonary effort is normal.  Abdominal:     General: Abdomen is flat. Bowel sounds are normal.     Palpations: Abdomen is soft.     Tenderness: There is no abdominal tenderness. There is no right CVA tenderness, left CVA tenderness or rebound. Negative signs include Murphy's sign and McBurney's sign.     Hernia: No hernia is present. There is no hernia in the left inguinal area.  Musculoskeletal:     Cervical back: Normal range of motion  and neck supple.     Lumbar back: Normal.     Left hip: Tenderness present. No crepitus. Normal range of motion. Normal strength.     Comments: Left anterior hip TTP and pain with flexion and abduction  LLE is neurovascularly intact   Lymphadenopathy:     Lower Body: No left inguinal adenopathy.  Skin:    General: Skin is warm and dry.  Neurological:     General: No focal deficit present.     Mental Status: He is alert and oriented to person, place, and time.  Psychiatric:  Mood and Affect: Mood normal.        Behavior: Behavior normal.        Thought Content: Thought content normal.      BP 118/84 (BP Location: Right Arm, Patient Position: Sitting, Cuff Size: Normal)   Pulse 64   Temp 98.4 F (36.9 C) (Oral)   Ht 6' 1 (1.854 m)   Wt 156 lb (70.8 kg)   BMI 20.58 kg/m  Wt Readings from Last 3 Encounters:  06/12/24 156 lb (70.8 kg)  05/15/24 160 lb (72.6 kg)  04/03/24 167 lb (75.8 kg)       Assessment & Plan:   Problem List Items Addressed This Visit     Moderate major depression (HCC)   Primary hypertension   Other Visit Diagnoses       Left hip pain    -  Primary   Relevant Medications   ketorolac  (TORADOL ) injection 60 mg (Completed) (Start on 06/12/2024  9:45 AM)   Other Relevant Orders   DG HIP UNILAT W OR W/O PELVIS 2-3 VIEWS LEFT       Assessment and Plan Assessment & Plan Depression Managed with counseling. He has not started duloxetine  due to concerns about its effects, particularly in the context of alcohol use. He reports feeling better overall and denies suicidal ideation. - Continue counseling for depression management. - Discussed potential side effects of duloxetine , especially in the context of alcohol use.  Left hip pain Acute left hip pain for approximately two months, worsening recently. Pain is localized to the groin area, exacerbated by movement, and severe enough to disrupt sleep. No history of injury, numbness, tingling, or  urinary symptoms. Differential includes arthritis or other musculoskeletal issues. - Ordered x-ray of the left hip to evaluate for arthritis or other structural issues. - Administered Toradol  injection for immediate pain relief. - Prescribed a 5-day course of prednisone  for inflammation and pain management. - Recommended extra strength Tylenol for pain management, avoiding NSAIDs due to past stomach issues. - Advised use of a heating pad and topical treatments for symptomatic relief. - Instructed to seek urgent care if symptoms worsen or do not improve with treatment.  Primary hypertension Blood pressure is well-controlled with current medication regimen. Recent readings show improvement after medication adjustment.  Alcohol use disorder He is not ready to address alcohol use disorder at this time. He acknowledges the impact of alcohol on his health and treatment options but is not prepared to make changes. - Discussed the impact of alcohol on health and treatment options. - Encouraged readiness to address alcohol use disorder when he feels prepared.  Weight loss - additional weight loss due to poor diet and daily alcohol use.  - Encourage increasing calories and protein      I have discontinued Rogerick L. Spindler's naltrexone . I am also having him start on predniSONE . Additionally, I am having him maintain his thiamine , cyanocobalamin , potassium chloride , pantoprazole , atorvastatin , nadolol , VITAMIN D  PO, DULoxetine , and valsartan -hydrochlorothiazide . We administered ketorolac . We will continue to administer sodium chloride .  Meds ordered this encounter  Medications   predniSONE  (DELTASONE ) 20 MG tablet    Sig: Take 1 tablet (20 mg total) by mouth daily with breakfast. Start on 06/13/24    Dispense:  10 tablet    Refill:  0    Supervising Provider:   ROLLENE NORRIS A [[id number]]   ketorolac  (TORADOL ) injection 60 mg

## 2024-06-12 NOTE — Patient Instructions (Signed)
 Please go downstairs for an x-ray of your left hip.  You received a Toradol  60 mg IM injection in the office today.  This is an anti-inflammatory.  Avoid taking any over-the-counter anti-inflammatories such as ibuprofen, Aleve, Motrin, aspirin.  Start prednisone  by mouth tomorrow morning with food and a full glass of water.  This is a steroid to help improve inflammation and pain.  If you need additional pain medication, you can take over-the-counter Tylenol 500 mg or 1000 mg twice daily.  I recommend using a heating pad.  Follow-up if you have any new or worsening symptoms such as fever, chills, abdominal pain, vomiting, urinary symptoms.

## 2024-06-17 ENCOUNTER — Other Ambulatory Visit: Payer: Self-pay | Admitting: Family Medicine

## 2024-06-17 DIAGNOSIS — I10 Essential (primary) hypertension: Secondary | ICD-10-CM

## 2024-08-19 ENCOUNTER — Ambulatory Visit: Admitting: Family Medicine

## 2024-08-26 ENCOUNTER — Encounter: Payer: Self-pay | Admitting: Family Medicine

## 2024-08-26 ENCOUNTER — Ambulatory Visit: Admitting: Family Medicine

## 2024-08-26 VITALS — BP 134/88 | HR 70 | Temp 97.8°F | Ht 73.0 in | Wt 163.0 lb

## 2024-08-26 DIAGNOSIS — F410 Panic disorder [episodic paroxysmal anxiety] without agoraphobia: Secondary | ICD-10-CM

## 2024-08-26 DIAGNOSIS — E559 Vitamin D deficiency, unspecified: Secondary | ICD-10-CM

## 2024-08-26 DIAGNOSIS — I700 Atherosclerosis of aorta: Secondary | ICD-10-CM | POA: Diagnosis not present

## 2024-08-26 DIAGNOSIS — Z113 Encounter for screening for infections with a predominantly sexual mode of transmission: Secondary | ICD-10-CM | POA: Diagnosis not present

## 2024-08-26 DIAGNOSIS — E876 Hypokalemia: Secondary | ICD-10-CM

## 2024-08-26 DIAGNOSIS — K219 Gastro-esophageal reflux disease without esophagitis: Secondary | ICD-10-CM | POA: Diagnosis not present

## 2024-08-26 DIAGNOSIS — E538 Deficiency of other specified B group vitamins: Secondary | ICD-10-CM | POA: Diagnosis not present

## 2024-08-26 DIAGNOSIS — F321 Major depressive disorder, single episode, moderate: Secondary | ICD-10-CM | POA: Diagnosis not present

## 2024-08-26 DIAGNOSIS — E519 Thiamine deficiency, unspecified: Secondary | ICD-10-CM | POA: Diagnosis not present

## 2024-08-26 DIAGNOSIS — I10 Essential (primary) hypertension: Secondary | ICD-10-CM

## 2024-08-26 DIAGNOSIS — E7800 Pure hypercholesterolemia, unspecified: Secondary | ICD-10-CM

## 2024-08-26 DIAGNOSIS — R7303 Prediabetes: Secondary | ICD-10-CM | POA: Diagnosis not present

## 2024-08-26 LAB — COMPREHENSIVE METABOLIC PANEL WITH GFR
ALT: 13 U/L (ref 3–53)
AST: 19 U/L (ref 5–37)
Albumin: 4.6 g/dL (ref 3.5–5.2)
Alkaline Phosphatase: 37 U/L — ABNORMAL LOW (ref 39–117)
BUN: 7 mg/dL (ref 6–23)
CO2: 26 meq/L (ref 19–32)
Calcium: 9.5 mg/dL (ref 8.4–10.5)
Chloride: 107 meq/L (ref 96–112)
Creatinine, Ser: 0.75 mg/dL (ref 0.40–1.50)
GFR: 107.38 mL/min
Glucose, Bld: 81 mg/dL (ref 70–99)
Potassium: 4 meq/L (ref 3.5–5.1)
Sodium: 143 meq/L (ref 135–145)
Total Bilirubin: 0.4 mg/dL (ref 0.2–1.2)
Total Protein: 7.5 g/dL (ref 6.0–8.3)

## 2024-08-26 LAB — CBC WITH DIFFERENTIAL/PLATELET
Basophils Absolute: 0 10*3/uL (ref 0.0–0.1)
Basophils Relative: 0.8 % (ref 0.0–3.0)
Eosinophils Absolute: 0.2 10*3/uL (ref 0.0–0.7)
Eosinophils Relative: 3.4 % (ref 0.0–5.0)
HCT: 40.9 % (ref 39.0–52.0)
Hemoglobin: 13.6 g/dL (ref 13.0–17.0)
Lymphocytes Relative: 31.2 % (ref 12.0–46.0)
Lymphs Abs: 1.6 10*3/uL (ref 0.7–4.0)
MCHC: 33.2 g/dL (ref 30.0–36.0)
MCV: 90.9 fl (ref 78.0–100.0)
Monocytes Absolute: 0.5 10*3/uL (ref 0.1–1.0)
Monocytes Relative: 8.8 % (ref 3.0–12.0)
Neutro Abs: 2.9 10*3/uL (ref 1.4–7.7)
Neutrophils Relative %: 55.8 % (ref 43.0–77.0)
Platelets: 279 10*3/uL (ref 150.0–400.0)
RBC: 4.5 Mil/uL (ref 4.22–5.81)
RDW: 17.2 % — ABNORMAL HIGH (ref 11.5–15.5)
WBC: 5.3 10*3/uL (ref 4.0–10.5)

## 2024-08-26 LAB — VITAMIN D 25 HYDROXY (VIT D DEFICIENCY, FRACTURES): VITD: 19.75 ng/mL — ABNORMAL LOW (ref 30.00–100.00)

## 2024-08-26 LAB — TSH: TSH: 1.03 u[IU]/mL (ref 0.35–5.50)

## 2024-08-26 LAB — VITAMIN B12: Vitamin B-12: 533 pg/mL (ref 211–911)

## 2024-08-26 LAB — HEMOGLOBIN A1C: Hgb A1c MFr Bld: 5.5 % (ref 4.6–6.5)

## 2024-08-26 MED ORDER — NADOLOL 20 MG PO TABS: ORAL_TABLET | ORAL | 1 refills | Status: AC

## 2024-08-26 MED ORDER — PANTOPRAZOLE SODIUM 40 MG PO TBEC: 40.0000 mg | DELAYED_RELEASE_TABLET | Freq: Every day | ORAL | 1 refills | Status: AC

## 2024-08-26 MED ORDER — ALPRAZOLAM 0.25 MG PO TABS: 0.2500 mg | ORAL_TABLET | Freq: Two times a day (BID) | ORAL | 0 refills | Status: AC | PRN

## 2024-08-26 MED ORDER — DULOXETINE HCL 30 MG PO CPEP: 30.0000 mg | ORAL_CAPSULE | Freq: Every day | ORAL | 1 refills | Status: AC

## 2024-08-26 MED ORDER — ATORVASTATIN CALCIUM 20 MG PO TABS: 20.0000 mg | ORAL_TABLET | Freq: Every day | ORAL | 1 refills | Status: AC

## 2024-08-26 MED ORDER — VALSARTAN-HYDROCHLOROTHIAZIDE 80-12.5 MG PO TABS: 1.0000 | ORAL_TABLET | Freq: Every day | ORAL | 1 refills | Status: AC

## 2024-08-26 NOTE — Patient Instructions (Signed)
 Please go to the lab before you leave today  Restart your medications.   Please monitor your blood pressure at home.  I will be in touch with your results and with recommendations   I will see you back in 3-4 months or sooner if needed

## 2024-08-26 NOTE — Progress Notes (Signed)
 "  Subjective:     Patient ID: Robert Bruce, male    DOB: 23-Mar-1976, 49 y.o.   MRN: [id number]  Chief Complaint  Patient presents with   Medical Management of Chronic Issues    HPI  Discussed the use of AI scribe software for clinical note transcription with the patient, who gave verbal consent to proceed.  History of Present Illness Robert Bruce is a 49 year old male who presents for follow-up and medication refill.  Medication nonadherence - Off nadolol , atorvastatin , valsartan , and hydrochlorothiazide  for the past month - Requests refills for these medications - Not taking vitamin D  or B12 supplements despite prior low levels  Mood disturbance and anxiety - Mood is generally stable but recently worsened by work-related stress - Has not started prescribed Cymbalta  - Experienced a severe panic attack a couple of weeks ago that felt like a heart attack, without recurrence. History of more frequent panic attacks   Sleep disturbance - Sleep duration is approximately six hours, from 8:30 PM to 4:00 AM - Wakes early even if going to bed later - Alcohol sometimes worsens sleep, but does not consume alcohol daily  Dyslipidemia and prediabetes - History of prediabetes - LDL improved from 234 to 104 while on atorvastatin  - Currently off cholesterol medication  Sexual health and std screening - Sexually active  - Requests STD testing for precaution - No genital sores, urinary symptoms, swelling, dizziness, chest pain, palpitations, or shortness of breath  Alcohol use - Drinks alcohol occasionally     Health Maintenance Due  Topic Date Due   DTaP/Tdap/Td (1 - Tdap) Never done   Pneumococcal Vaccine (1 of 2 - PCV) Never done   Hepatitis B Vaccines 19-59 Average Risk (1 of 3 - 19+ 3-dose series) Never done    Past Medical History:  Diagnosis Date   Asthma    Hypertension    Prediabetes 06/25/2023    Past Surgical History:  Procedure Laterality Date    lymph node removal      Family History  Problem Relation Age of Onset   Hyperlipidemia Mother    Hypertension Brother    Kidney disease Maternal Grandmother    Colon cancer Neg Hx    Esophageal cancer Neg Hx     Social History   Socioeconomic History   Marital status: Married    Spouse name: Not on file   Number of children: Not on file   Years of education: Not on file   Highest education level: Not on file  Occupational History   Not on file  Tobacco Use   Smoking status: Some Days    Current packs/day: 0.25    Types: Cigarettes   Smokeless tobacco: Never  Vaping Use   Vaping status: Never Used  Substance and Sexual Activity   Alcohol use: Yes    Alcohol/week: 9.0 standard drinks of alcohol    Types: 9 Shots of liquor per week    Comment: most days   Drug use: No   Sexual activity: Not on file  Other Topics Concern   Not on file  Social History Narrative   Not on file   Social Drivers of Health   Tobacco Use: High Risk (08/26/2024)   Patient History    Smoking Tobacco Use: Some Days    Smokeless Tobacco Use: Never    Passive Exposure: Not on file  Financial Resource Strain: Not on file  Food Insecurity: Not on file  Transportation Needs: Not on  file  Physical Activity: Not on file  Stress: Not on file  Social Connections: Not on file  Intimate Partner Violence: Not on file  Depression (PHQ2-9): High Risk (04/03/2024)   Depression (PHQ2-9)    PHQ-2 Score: 13  Alcohol Screen: Not on file  Housing: Not on file  Utilities: Not on file  Health Literacy: Not on file    Outpatient Medications Prior to Visit  Medication Sig Dispense Refill   cyanocobalamin  (VITAMIN B12) 500 MCG tablet Take 500 mcg by mouth daily. (Patient not taking: Reported on 08/26/2024)     potassium chloride  (KLOR-CON  M) 10 MEQ tablet Take 1 tablet (10 mEq total) by mouth daily. (Patient not taking: Reported on 08/26/2024) 30 tablet 1   thiamine  (VITAMIN B1) 100 MG tablet Take 1 tablet  (100 mg total) by mouth daily. (Patient not taking: Reported on 08/26/2024) 90 tablet 0   VITAMIN D  PO Take by mouth. (Patient not taking: Reported on 08/26/2024)     atorvastatin  (LIPITOR) 20 MG tablet Take 1 tablet (20 mg total) by mouth daily. (Patient not taking: Reported on 08/26/2024) 90 tablet 1   DULoxetine  (CYMBALTA ) 30 MG capsule Take 1 capsule (30 mg total) by mouth daily. (Patient not taking: Reported on 08/26/2024) 30 capsule 1   nadolol  (CORGARD ) 20 MG tablet TAKE 1 TABLET(20 MG) BY MOUTH DAILY (Patient not taking: Reported on 08/26/2024) 90 tablet 1   pantoprazole  (PROTONIX ) 40 MG tablet Take 1 tablet (40 mg total) by mouth daily. (Patient not taking: Reported on 08/26/2024) 90 tablet 0   predniSONE  (DELTASONE ) 20 MG tablet Take 1 tablet (20 mg total) by mouth daily with breakfast. Start on 06/13/24 10 tablet 0   valsartan -hydrochlorothiazide  (DIOVAN -HCT) 80-12.5 MG tablet Take 1 tablet by mouth daily. (Patient not taking: Reported on 08/26/2024) 90 tablet 3   Facility-Administered Medications Prior to Visit  Medication Dose Route Frequency Provider Last Rate Last Admin   0.9 %  sodium chloride  infusion  500 mL Intravenous Continuous Charlanne Groom, MD        Allergies[1]  ROS Per HPI     Objective:    Physical Exam Constitutional:      General: He is not in acute distress.    Appearance: He is not ill-appearing.  HENT:     Mouth/Throat:     Mouth: Mucous membranes are moist.     Pharynx: Oropharynx is clear.  Eyes:     Extraocular Movements: Extraocular movements intact.     Conjunctiva/sclera: Conjunctivae normal.     Pupils: Pupils are equal, round, and reactive to light.  Cardiovascular:     Rate and Rhythm: Normal rate and regular rhythm.  Pulmonary:     Effort: Pulmonary effort is normal.     Breath sounds: Normal breath sounds.  Musculoskeletal:     Cervical back: Normal range of motion and neck supple.     Right lower leg: No edema.     Left lower leg: No edema.   Skin:    General: Skin is warm and dry.  Neurological:     General: No focal deficit present.     Mental Status: He is alert and oriented to person, place, and time.  Psychiatric:        Mood and Affect: Mood normal.        Behavior: Behavior normal.        Thought Content: Thought content normal.      BP 134/88   Pulse 70   Temp 97.8  F (36.6 C) (Temporal)   Ht 6' 1 (1.854 m)   Wt 163 lb (73.9 kg)   SpO2 98%   BMI 21.51 kg/m  Wt Readings from Last 3 Encounters:  08/26/24 163 lb (73.9 kg)  06/12/24 156 lb (70.8 kg)  05/15/24 160 lb (72.6 kg)       Assessment & Plan:   Problem List Items Addressed This Visit     Aortic atherosclerosis   Relevant Medications   nadolol  (CORGARD ) 20 MG tablet   atorvastatin  (LIPITOR) 20 MG tablet   valsartan -hydrochlorothiazide  (DIOVAN -HCT) 80-12.5 MG tablet   B12 deficiency   Relevant Orders   Vitamin B12 (Completed)   Gastroesophageal reflux disease   Relevant Medications   pantoprazole  (PROTONIX ) 40 MG tablet   Hypercholesterolemia with LDL greater than 190 mg/dL   Relevant Medications   nadolol  (CORGARD ) 20 MG tablet   atorvastatin  (LIPITOR) 20 MG tablet   valsartan -hydrochlorothiazide  (DIOVAN -HCT) 80-12.5 MG tablet   Moderate major depression (HCC)   Relevant Medications   DULoxetine  (CYMBALTA ) 30 MG capsule   ALPRAZolam  (XANAX ) 0.25 MG tablet   Prediabetes   Relevant Orders   Hemoglobin A1c (Completed)   Primary hypertension - Primary   Relevant Medications   nadolol  (CORGARD ) 20 MG tablet   atorvastatin  (LIPITOR) 20 MG tablet   valsartan -hydrochlorothiazide  (DIOVAN -HCT) 80-12.5 MG tablet   Other Relevant Orders   CBC with Differential/Platelet (Completed)   Comprehensive metabolic panel with GFR (Completed)   TSH (Completed)   Thiamine  deficiency   Relevant Orders   Vitamin B1   Other Visit Diagnoses       Vitamin D  deficiency       Relevant Orders   VITAMIN D  25 Hydroxy (Vit-D Deficiency, Fractures)  (Completed)     Screening for STD (sexually transmitted disease)       Relevant Orders   GC/Chlamydia Probe Amp   HIV Antibody (routine testing w rflx)   RPR W/RFLX TO RPR TITER, TREPONEMAL AB, SCREEN AND DIAGNOSIS   Hepatitis C antibody   Hepatitis B surface antibody,qualitative     Hypokalemia       Relevant Orders   Comprehensive metabolic panel with GFR (Completed)     Panic attacks       Relevant Medications   DULoxetine  (CYMBALTA ) 30 MG capsule   ALPRAZolam  (XANAX ) 0.25 MG tablet       Assessment and Plan Assessment & Plan Major depressive disorder, moderate Moderate major depressive disorder with recent exacerbation due to work stress. No current medication adherence. Recent panic attack likely related to depression and anxiety. - Restart duloxetine  (Cymbalta ) 30 mg oral daily for mood stabilization and potential sleep improvement. - Prescribed benzodiazepine for acute panic attacks, with caution against alcohol use.  Primary hypertension Hypertension management interrupted due to lack of medication adherence. No recent home blood pressure monitoring. - Restarted nadolol  (Corgard ) 20 mg oral daily. - Restarted valsartan -hydrochlorothiazide  (Diovan -HCT) 80-12.5 mg oral daily. - Encouraged home blood pressure monitoring.  Pure hypercholesterolemia Hypercholesterolemia with previous LDL of 234 mg/dL, improved to 895 mg/dL with medication. Medication adherence lapsed. - Restart atorvastatin  (Lipitor) 20 mg oral daily.  Prediabetes Previous blood work indicated prediabetes. No recent follow-up testing. - Ordered blood work to reassess glucose levels.  Vitamin D  deficiency Vitamin D  deficiency with no recent supplementation. - Ordered blood work to assess current vitamin D  levels. - Restart vitamin D  supplementation.  Vitamin B12 deficiency Vitamin B12 deficiency with no recent supplementation. - Ordered blood work to assess current vitamin B12  levels. - Restart  cyanocobalamin  (vitamin B12) 500 mcg oral daily.  Alcohol use disorder Alcohol use disorder with reduced frequency of consumption. Alcohol may contribute to sleep disturbances and panic attacks. - Advised against alcohol consumption, especially before bedtime.  Left hip osteoarthritis Chronic left hip pain likely due to osteoarthritis. Duloxetine  may provide pain relief. - Restart duloxetine  (Cymbalta ) 30 mg oral daily for pain management.  Gastroesophageal reflux disease GERD with no recent symptoms reported. Medication adherence lapsed. - Restart pantoprazole  (Protonix ) 40 mg oral daily.  Panic disorder Recent panic attack likely related to depression and anxiety. No current medication for acute management. - Prescribed benzodiazepine for acute panic attacks, with caution against alcohol use.  General health maintenance Routine health maintenance discussed, including STD testing due to personal circumstances. - Ordered STD testing including urine      I have discontinued Pedrohenrique L. Burkemper's predniSONE . I am also having him start on ALPRAZolam . Additionally, I am having him maintain his thiamine , cyanocobalamin , potassium chloride , VITAMIN D  PO, nadolol , atorvastatin , valsartan -hydrochlorothiazide , DULoxetine , and pantoprazole . We will continue to administer sodium chloride .  Meds ordered this encounter  Medications   nadolol  (CORGARD ) 20 MG tablet    Sig: TAKE 1 TABLET(20 MG) BY MOUTH DAILY    Dispense:  90 tablet    Refill:  1   atorvastatin  (LIPITOR) 20 MG tablet    Sig: Take 1 tablet (20 mg total) by mouth daily.    Dispense:  90 tablet    Refill:  1   valsartan -hydrochlorothiazide  (DIOVAN -HCT) 80-12.5 MG tablet    Sig: Take 1 tablet by mouth daily.    Dispense:  90 tablet    Refill:  1   DULoxetine  (CYMBALTA ) 30 MG capsule    Sig: Take 1 capsule (30 mg total) by mouth daily.    Dispense:  90 capsule    Refill:  1   pantoprazole  (PROTONIX ) 40 MG tablet    Sig: Take 1  tablet (40 mg total) by mouth daily.    Dispense:  90 tablet    Refill:  1   ALPRAZolam  (XANAX ) 0.25 MG tablet    Sig: Take 1 tablet (0.25 mg total) by mouth 2 (two) times daily as needed for anxiety.    Dispense:  10 tablet    Refill:  0    Supervising Provider:   ROLLENE NORRIS A [[id number]]       [1] No Known Allergies  "

## 2024-11-24 ENCOUNTER — Ambulatory Visit: Admitting: Family Medicine
